# Patient Record
Sex: Female | Born: 1990 | Race: White | Hispanic: No | Marital: Single | Smoking: Current every day smoker
Health system: Southern US, Community
[De-identification: ages and names within clinical notes are randomized; demographics above are authoritative.]

## PROBLEM LIST (undated history)

## (undated) ENCOUNTER — Emergency Department (HOSPITAL_COMMUNITY): Admission: EM | Payer: Medicaid Other | Source: Home / Self Care

## (undated) DIAGNOSIS — F410 Panic disorder [episodic paroxysmal anxiety] without agoraphobia: Secondary | ICD-10-CM

## (undated) DIAGNOSIS — F32A Depression, unspecified: Secondary | ICD-10-CM

## (undated) DIAGNOSIS — D649 Anemia, unspecified: Secondary | ICD-10-CM

## (undated) DIAGNOSIS — Z3490 Encounter for supervision of normal pregnancy, unspecified, unspecified trimester: Secondary | ICD-10-CM

## (undated) DIAGNOSIS — N390 Urinary tract infection, site not specified: Secondary | ICD-10-CM

## (undated) DIAGNOSIS — N730 Acute parametritis and pelvic cellulitis: Secondary | ICD-10-CM

## (undated) DIAGNOSIS — F329 Major depressive disorder, single episode, unspecified: Secondary | ICD-10-CM

## (undated) DIAGNOSIS — F419 Anxiety disorder, unspecified: Secondary | ICD-10-CM

## (undated) HISTORY — DX: Major depressive disorder, single episode, unspecified: F32.9

## (undated) HISTORY — DX: Anemia, unspecified: D64.9

## (undated) HISTORY — DX: Depression, unspecified: F32.A

## (undated) HISTORY — PX: BLADDER SURGERY: SHX569

---
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
## 2011-05-20 LAB — BASIC METABOLIC PANEL
BUN: 16
CO2: 28
Calcium: 9.2
Chloride: 108
Creatinine, Ser: 0.92
Glucose, Bld: 103 — ABNORMAL HIGH
Potassium: 4
Sodium: 141

## 2011-05-20 LAB — CBC
HCT: 35.8 — ABNORMAL LOW
Hemoglobin: 12.7
MCHC: 35.5
MCV: 92.2
RDW: 13.2

## 2011-05-20 LAB — RAPID URINE DRUG SCREEN, HOSP PERFORMED
Amphetamines: NOT DETECTED
Barbiturates: NOT DETECTED
Benzodiazepines: NOT DETECTED
Cocaine: POSITIVE — AB
Opiates: NOT DETECTED
Tetrahydrocannabinol: POSITIVE — AB

## 2011-05-20 LAB — DIFFERENTIAL
Basophils Absolute: 0
Basophils Relative: 0
Eosinophils Absolute: 0.1
Eosinophils Relative: 2
Lymphocytes Relative: 28
Monocytes Absolute: 0.9

## 2011-05-20 LAB — PREGNANCY, URINE: Preg Test, Ur: NEGATIVE

## 2011-05-20 LAB — ETHANOL: Alcohol, Ethyl (B): <5

## 2011-05-24 LAB — URINALYSIS, ROUTINE W REFLEX MICROSCOPIC
Bilirubin Urine: NEGATIVE
Hgb urine dipstick: NEGATIVE
Specific Gravity, Urine: 1.025
Urobilinogen, UA: 1

## 2011-05-24 LAB — WET PREP, GENITAL: Yeast Wet Prep HPF POC: NONE SEEN

## 2011-05-24 LAB — GC/CHLAMYDIA PROBE AMP, GENITAL: GC Probe Amp, Genital: NEGATIVE

## 2011-05-24 LAB — URINE MICROSCOPIC-ADD ON

## 2011-11-12 ENCOUNTER — Encounter (HOSPITAL_BASED_OUTPATIENT_CLINIC_OR_DEPARTMENT_OTHER): Payer: Self-pay | Admitting: *Deleted

## 2011-11-12 ENCOUNTER — Emergency Department (HOSPITAL_BASED_OUTPATIENT_CLINIC_OR_DEPARTMENT_OTHER)
Admission: EM | Admit: 2011-11-12 | Discharge: 2011-11-12 | Disposition: A | Discharge status: Home Health | Payer: Self-pay | Attending: Emergency Medicine | Admitting: Emergency Medicine

## 2011-11-12 DIAGNOSIS — R300 Dysuria: Secondary | ICD-10-CM | POA: Insufficient documentation

## 2011-11-12 DIAGNOSIS — R350 Frequency of micturition: Secondary | ICD-10-CM | POA: Insufficient documentation

## 2011-11-12 DIAGNOSIS — N898 Other specified noninflammatory disorders of vagina: Secondary | ICD-10-CM | POA: Insufficient documentation

## 2011-11-12 DIAGNOSIS — N390 Urinary tract infection, site not specified: Secondary | ICD-10-CM | POA: Insufficient documentation

## 2011-11-12 DIAGNOSIS — R112 Nausea with vomiting, unspecified: Secondary | ICD-10-CM | POA: Insufficient documentation

## 2011-11-12 DIAGNOSIS — J45909 Unspecified asthma, uncomplicated: Secondary | ICD-10-CM | POA: Insufficient documentation

## 2011-11-12 DIAGNOSIS — M545 Low back pain, unspecified: Secondary | ICD-10-CM | POA: Insufficient documentation

## 2011-11-12 DIAGNOSIS — F17200 Nicotine dependence, unspecified, uncomplicated: Secondary | ICD-10-CM | POA: Insufficient documentation

## 2011-11-12 DIAGNOSIS — N739 Female pelvic inflammatory disease, unspecified: Secondary | ICD-10-CM | POA: Insufficient documentation

## 2011-11-12 DIAGNOSIS — R10819 Abdominal tenderness, unspecified site: Secondary | ICD-10-CM | POA: Insufficient documentation

## 2011-11-12 HISTORY — DX: Urinary tract infection, site not specified: N39.0

## 2011-11-12 LAB — WET PREP, GENITAL

## 2011-11-12 LAB — URINALYSIS, MICROSCOPIC ONLY
Nitrite: NEGATIVE
Specific Gravity, Urine: 1.015 (ref 1.005–1.030)
pH: 6 (ref 5.0–8.0)

## 2011-11-12 LAB — PREGNANCY, URINE: Preg Test, Ur: NEGATIVE

## 2011-11-12 MED ORDER — OXYCODONE-ACETAMINOPHEN 5-325 MG PO TABS
2.0000 | ORAL_TABLET | Freq: Once | ORAL | Status: AC
  Administered 2011-11-12: 2 via ORAL
  Filled 2011-11-12: qty 2

## 2011-11-12 MED ORDER — AZITHROMYCIN 1 G PO PACK
1.0000 g | PACK | Freq: Once | ORAL | Status: AC
  Administered 2011-11-12: 1 g via ORAL
  Filled 2011-11-12: qty 1

## 2011-11-12 MED ORDER — CEPHALEXIN 500 MG PO CAPS: 500.0000 mg | ORAL_CAPSULE | Freq: Four times a day (QID) | ORAL | Status: AC

## 2011-11-12 MED ORDER — CEFTRIAXONE SODIUM 1 G IJ SOLR
1.0000 g | Freq: Once | INTRAMUSCULAR | Status: AC
  Administered 2011-11-12: 1 g via INTRAMUSCULAR
  Filled 2011-11-12: qty 10

## 2011-11-12 MED ORDER — LIDOCAINE HCL (PF) 1 % IJ SOLN
INTRAMUSCULAR | Status: AC
  Administered 2011-11-12: 5 mL
  Filled 2011-11-12: qty 5

## 2011-11-12 MED ORDER — DOXYCYCLINE HYCLATE 100 MG PO CAPS: 100.0000 mg | ORAL_CAPSULE | Freq: Two times a day (BID) | ORAL | Status: AC

## 2011-11-12 NOTE — ED Provider Notes (Signed)
History     CSN: [id number]  Arrival date & time 11/12/11  0945   First MD Initiated Contact with Patient 11/12/11 [phone]      Chief Complaint  Patient presents with  . Dysuria    (Consider location/radiation/quality/duration/timing/severity/associated sxs/prior treatment) The history is provided by the patient.   the patient reports development of vaginal discharge and low back pain approximately one week ago.  She's had associated nausea and vomiting.  Or vaginal discharge has not improved.  She has had unprotected sex.  She does report a history of pelvic inflammatory disease before in the past reports her this reminds her of this.  She also reports dysuria and urinary frequency with associated nausea and vomiting without flank pain.  She denies fevers or chills.  She reports a long-standing history of chronic UTIs and has had bladder surgery when she was younger.  She also reports this is a urinary tract infection.  She reports mild suprapubic tenderness.  Nothing worsens her symptoms.  Nothing improves her symptoms.  Symptoms are constant.  Past Medical History  Diagnosis Date  . UTI (urinary tract infection)   . Asthma     History reviewed. No pertinent past surgical history.  History reviewed. No pertinent family history.  History  Substance Use Topics  . Smoking status: Current Everyday Smoker  . Smokeless tobacco: Not on file  . Alcohol Use:     OB History    Grav Para Term Preterm Abortions TAB SAB Ect Mult Living                  Review of Systems  All other systems reviewed and are negative.    Allergies  Sulfa antibiotics  Home Medications   Current Outpatient Rx  Name Route Sig Dispense Refill  . CEPHALEXIN 500 MG PO CAPS Oral Take 1 capsule (500 mg total) by mouth 4 (four) times daily. 28 capsule 0  . DOXYCYCLINE HYCLATE 100 MG PO CAPS Oral Take 1 capsule (100 mg total) by mouth 2 (two) times daily. 14 capsule 0    BP 119/77  Pulse 88  Temp(Src)  97.3 F (36.3 C) (Oral)  Resp 20  SpO2 100%  LMP 11/01/2011  Physical Exam  Nursing note and vitals reviewed. Constitutional: She is oriented to person, place, and time. She appears well-developed and well-nourished. No distress.  HENT:  Head: Normocephalic and atraumatic.  Eyes: EOM are normal.  Neck: Normal range of motion.  Cardiovascular: Normal rate, regular rhythm and normal heart sounds.   Pulmonary/Chest: Effort normal and breath sounds normal.  Abdominal: Soft. She exhibits no distension. There is no tenderness.  Genitourinary:       Normal external genitalia.  Significant vaginal discharge.  Cervical motion tenderness with cervical erythema.  No adnexal masses or fullness  Musculoskeletal: Normal range of motion.  Neurological: She is alert and oriented to person, place, and time.  Skin: Skin is warm and dry.  Psychiatric: She has a normal mood and affect. Judgment normal.    ED Course  Procedures (including critical care time)  Labs Reviewed  URINALYSIS, WITH MICROSCOPIC - Abnormal; Notable for the following:    APPearance CLOUDY (*)    Hgb urine dipstick SMALL (*)    Leukocytes, UA MODERATE (*)    Bacteria, UA MANY (*)    Squamous Epithelial / LPF FEW (*)    All other components within normal limits  WET PREP, GENITAL - Abnormal; Notable for the following:  Trich, Wet Prep FEW (*)    Clue Cells Wet Prep HPF POC MANY (*)    WBC, Wet Prep HPF POC MANY (*)    All other components within normal limits  PREGNANCY, URINE  GC/CHLAMYDIA PROBE AMP, GENITAL  URINE CULTURE   No results found.   1. Pelvic inflammatory disease (PID)   2. Urinary tract infection       MDM  Patient feels much better at this time.  DC home with treatment for both UTI and PID.  She understands the importance of returning for new or worsening symptoms        Lyanne Co, MD 11/12/11 1229

## 2011-11-12 NOTE — ED Notes (Signed)
Pt amb to room 9 with quick steady gait smiling in nad. Pt reports her usual uti sx, dysuria, lbp, states she has had chronic uti's with bladder surgery since the age 21.

## 2011-11-12 NOTE — ED Notes (Signed)
Spoke to a Teacher, early years/pre at [facility] in [place]. Called regarding pt stating she could not afford the $50 Doxy Rx. Dr Fredderick Phenix made aware and stated the only alternative medication would be Clindamycin PO. Pharmacist states the cost of that Rx would be $75. Pt states she could not afford that either. Pt aware that no cheaper antitbiotic was available.

## 2011-11-12 NOTE — Discharge Instructions (Signed)
Pelvic Inflammatory Disease  Pelvic Inflammatory Disease (PID) is an infection in some or all of your female organs. This includes the womb (uterus), ovaries, fallopian tubes and tissues in the pelvis. PID is a common cause of sudden onset (acute) lower abdominal (pelvic) pain. PID can be treated, but it is a serious infection. It may take weeks before you are completely well. In some cases, hospitalization is needed for surgery or to administer medications to kill germs (antibiotics) through your veins (intravenously).  CAUSES    It may be caused by germs that are spread during sexual contact.   PID can also occur following:   The birth of a baby.   A miscarriage.   An abortion.   Major surgery of the pelvis.   Use of an IUD.   Sexual assault.  SYMPTOMS    Abdominal or pelvic pain.   Fever.   Chills.   Abnormal vaginal discharge.  DIAGNOSIS   Your caregiver will choose some of these methods to make a diagnosis:   A physical exam and history.   Blood tests.   Cultures of the vagina and cervix.   X-rays or ultrasound.   A procedure to look inside the pelvis (laparoscopy).  TREATMENT    Use of antibiotics by mouth or intravenously.   Treatment of sexual partners when the infection is an sexually transmitted disease (STD).   Hospitalization and surgery may be needed.  RISKS AND COMPLICATIONS    PID can cause women to become unable to have children (sterile) if left untreated or if partially treated. That is why it is important to finish all medications given to you.   Sterility or future tubal (ectopic) pregnancies can occur in fully treated individuals. This is why it is so important to follow your prescribed treatment.   It can cause longstanding (chronic) pelvic pain after frequent infections.   Painful intercourse.   Pelvic abscesses.   In rare cases, surgery or a hysterectomy may be needed.   If this is a sexually transmitted infection (STI), you are also at risk for any other STD  including AIDSor human papillomavirus (HPV).  HOME CARE INSTRUCTIONS    Finish all medication as prescribed. Incomplete treatment will put you at risk for sterility and tubal pregnancy.   Only take over-the-counter or prescription medicines for pain, discomfort, or fever as directed by your caregiver.   Do not have sex until treatment is completed or as directed by your caregiver. If PID is confirmed, your recent sexual contacts will need treatment.   Keep your follow-up appointments.  SEEK MEDICAL CARE IF:    You have increased or abnormal vaginal discharge.   You need prescription medication for your pain.   Your partner has an STD.   You are vomiting.   You cannot take your medications.  SEEK IMMEDIATE MEDICAL CARE IF:    You have a fever.   You develop increased abdominal or pelvic pain.   You develop chills.   You have pain when you urinate.   You are not better after 72 hours following treatment.  Document Released: 08/09/2005 Document Revised: 07/29/2011 Document Reviewed: 04/22/2007  [employer] Patient Information 2012 [employer].

## 2011-11-15 LAB — URINE CULTURE: Culture  Setup Time: 201303230221

## 2011-11-16 NOTE — ED Notes (Signed)
Pt called back today to check on her cultures. GC negative but pt does have uti with >100,000 ecoli. Pt reports that she did not get her prescriptions filled because of their cost. Talked with pt about getting the doxy changed to generic but it will still be $50 and she can't afford it. Pt also did not get her Keflex presciption filled, and pt is still having symptoms of uti. Encouraged pt to try and get prescriptions filled or return to ed for further treatment.

## 2011-11-16 NOTE — ED Notes (Signed)
+   Urine  Treated per protocol MD; Sensitive to same 

## 2012-01-22 ENCOUNTER — Emergency Department (HOSPITAL_COMMUNITY)
Admission: EM | Admit: 2012-01-22 | Discharge: 2012-01-22 | Disposition: A | Discharge status: Home / Self Care | Payer: Self-pay | Attending: Emergency Medicine | Admitting: Emergency Medicine

## 2012-01-22 ENCOUNTER — Encounter (HOSPITAL_COMMUNITY): Payer: Self-pay | Admitting: Emergency Medicine

## 2012-01-22 DIAGNOSIS — IMO0002 Reserved for concepts with insufficient information to code with codable children: Secondary | ICD-10-CM

## 2012-01-22 DIAGNOSIS — F17200 Nicotine dependence, unspecified, uncomplicated: Secondary | ICD-10-CM | POA: Insufficient documentation

## 2012-01-22 DIAGNOSIS — S91309A Unspecified open wound, unspecified foot, initial encounter: Secondary | ICD-10-CM | POA: Insufficient documentation

## 2012-01-22 DIAGNOSIS — W268XXA Contact with other sharp object(s), not elsewhere classified, initial encounter: Secondary | ICD-10-CM | POA: Insufficient documentation

## 2012-01-22 MED ORDER — LIDOCAINE-EPINEPHRINE (PF) 1 %-1:200000 IJ SOLN
INTRAMUSCULAR | Status: AC
  Administered 2012-01-22: 10 mL
  Filled 2012-01-22: qty 10

## 2012-01-22 MED ORDER — LIDOCAINE-EPINEPHRINE-TETRACAINE (LET) SOLUTION
3.0000 mL | Freq: Once | NASAL | Status: AC
  Administered 2012-01-22: 3 mL via TOPICAL
  Filled 2012-01-22: qty 3

## 2012-01-22 MED ORDER — IBUPROFEN 800 MG PO TABS
800.0000 mg | ORAL_TABLET | Freq: Once | ORAL | Status: AC
  Administered 2012-01-22: 800 mg via ORAL
  Filled 2012-01-22: qty 1

## 2012-01-22 MED ORDER — IBUPROFEN 600 MG PO TABS: 600.0000 mg | ORAL_TABLET | Freq: Four times a day (QID) | ORAL | Status: AC | PRN

## 2012-01-22 NOTE — ED Notes (Signed)
MD at bedside. PAC at bedside. Suturing lac on foot,

## 2012-01-22 NOTE — ED Notes (Signed)
Pt has laceration to the top of her left foot. Pt does not know how she did it. Bleeding controlled at this time with gauze and coban.
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
## 2012-01-22 NOTE — ED Notes (Signed)
Pt ambulated to restroom, causing laceration to bleed. Wound wrapped with cling, let remains secured to wound.
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
## 2012-01-22 NOTE — Discharge Instructions (Signed)

## 2012-01-23 NOTE — ED Provider Notes (Signed)
Medical screening examination/treatment/procedure(s) were performed by non-physician practitioner and as supervising physician I was immediately available for consultation/collaboration.   Dayton Bailiff, MD 01/23/12 [phone]

## 2012-05-08 ENCOUNTER — Emergency Department (HOSPITAL_COMMUNITY)
Admission: EM | Admit: 2012-05-08 | Discharge: 2012-05-08 | Disposition: A | Discharge status: Home / Self Care | Payer: Self-pay | Attending: Emergency Medicine | Admitting: Emergency Medicine

## 2012-05-08 ENCOUNTER — Encounter (HOSPITAL_COMMUNITY): Payer: Self-pay

## 2012-05-08 DIAGNOSIS — Z202 Contact with and (suspected) exposure to infections with a predominantly sexual mode of transmission: Secondary | ICD-10-CM

## 2012-05-08 DIAGNOSIS — F17200 Nicotine dependence, unspecified, uncomplicated: Secondary | ICD-10-CM | POA: Insufficient documentation

## 2012-05-08 DIAGNOSIS — N390 Urinary tract infection, site not specified: Secondary | ICD-10-CM | POA: Insufficient documentation

## 2012-05-08 HISTORY — DX: Acute parametritis and pelvic cellulitis: N73.0

## 2012-05-08 LAB — URINALYSIS, ROUTINE W REFLEX MICROSCOPIC
Bilirubin Urine: NEGATIVE
Nitrite: POSITIVE — AB
Specific Gravity, Urine: 1.025 (ref 1.005–1.030)
pH: 6 (ref 5.0–8.0)

## 2012-05-08 LAB — BASIC METABOLIC PANEL
CO2: 28 mEq/L (ref 19–32)
GFR calc non Af Amer: >90 mL/min (ref >90)
Glucose, Bld: 91 mg/dL (ref 70–99)
Potassium: 4 mEq/L (ref 3.5–5.1)
Sodium: 138 mEq/L (ref 135–145)

## 2012-05-08 LAB — POCT PREGNANCY, URINE: Preg Test, Ur: NEGATIVE

## 2012-05-08 LAB — CBC WITH DIFFERENTIAL/PLATELET
Lymphocytes Relative: 42 % (ref 12–46)
Lymphs Abs: 2.2 10*3/uL (ref 0.7–4.0)
Neutrophils Relative %: 46 % (ref 43–77)
Platelets: 145 10*3/uL — ABNORMAL LOW (ref 150–400)
RBC: 4.05 MIL/uL (ref 3.87–5.11)
WBC: 5.2 10*3/uL (ref 4.0–10.5)

## 2012-05-08 LAB — URINE MICROSCOPIC-ADD ON

## 2012-05-08 LAB — WET PREP, GENITAL: Trich, Wet Prep: NONE SEEN

## 2012-05-08 MED ORDER — SODIUM CHLORIDE 0.9 % IV BOLUS (SEPSIS)
250.0000 mL | Freq: Once | INTRAVENOUS | Status: AC
  Administered 2012-05-08: 1000 mL via INTRAVENOUS

## 2012-05-08 MED ORDER — DEXTROSE 5 % IV SOLN
1.0000 g | Freq: Once | INTRAVENOUS | Status: AC
  Administered 2012-05-08: 1 g via INTRAVENOUS
  Filled 2012-05-08: qty 10

## 2012-05-08 MED ORDER — AZITHROMYCIN 250 MG PO TABS
1000.0000 mg | ORAL_TABLET | Freq: Once | ORAL | Status: AC
  Administered 2012-05-08: 1000 mg via ORAL
  Filled 2012-05-08: qty 4

## 2012-05-08 MED ORDER — CEPHALEXIN 500 MG PO CAPS: 500.0000 mg | ORAL_CAPSULE | Freq: Four times a day (QID) | ORAL | Status: DC

## 2012-05-08 MED ORDER — NAPROXEN 500 MG PO TABS: 500.0000 mg | ORAL_TABLET | Freq: Two times a day (BID) | ORAL | Status: DC

## 2012-05-08 MED ORDER — SODIUM CHLORIDE 0.9 % IV SOLN: INTRAVENOUS | Status: DC

## 2012-05-08 NOTE — ED Provider Notes (Signed)
History   This chart was scribed for Adrienne Jakes, MD by Gerlean Ren. This patient was seen in room [facility]08/[facility]08 and the patient's care was started at 7:58AM.   CSN: [id number]  Arrival date & time 05/08/12  7829   First MD Initiated Contact with Patient 05/08/12 [phone]      Chief Complaint  Patient presents with  . Back Pain    (Consider location/radiation/quality/duration/timing/severity/associated sxs/prior treatment) Patient is a 21 y.o. female presenting with back pain. The history is provided by the patient. No language interpreter was used.  Back Pain  Associated symptoms include a fever, headaches and abdominal pain (lower quadrants). Pertinent negatives include no chest pain.  NATALEAH SCIONEAUX is a 21 y.o. female who presents to the Emergency Department complaining of two weeks of lower quadrant abdominal pain that is usually dull (7 out of 10) but sometimes sharp radiating to lower back with associated clear, non-bloody vaginal discharge.  Pt reprots fever, visual disturbances, near-fainting, sore throat, and HA as associated symptoms.  LNMP 04/24/2012.  Pt denies chest pain and dyspnea as associated.  Pt has h/o UTI and PID.  Pt has had bladder surgery.  Pt is a current everyday smoker but denies current alcohol use.   Past Medical History  Diagnosis Date  . UTI (urinary tract infection)   . Asthma   . PID (acute pelvic inflammatory disease)     Past Surgical History  Procedure Date  . Bladder surgery     No family history on file.  History  Substance Use Topics  . Smoking status: Current Every Day Smoker    Types: Cigarettes  . Smokeless tobacco: Not on file  . Alcohol Use: Yes     former    OB History    Grav Para Term Preterm Abortions TAB SAB Ect Mult Living                  Review of Systems  Constitutional: Positive for fever.  HENT: Positive for sore throat. Negative for neck pain.   Eyes: Positive for visual disturbance.  Respiratory:  Negative for shortness of breath.   Cardiovascular: Negative for chest pain.  Gastrointestinal: Positive for abdominal pain (lower quadrants).  Genitourinary: Positive for vaginal discharge. Negative for vaginal bleeding.  Musculoskeletal: Positive for back pain.  Skin: Negative for rash.  Neurological: Positive for light-headedness and headaches.  Hematological: Negative for adenopathy.  Psychiatric/Behavioral: Negative for confusion.  All other systems reviewed and are negative.    Allergies  Sulfa antibiotics  Home Medications   Current Outpatient Rx  Name Route Sig Dispense Refill  . IBUPROFEN 200 MG PO TABS Oral Take 800 mg by mouth every 6 (six) hours as needed. Pain    . CEPHALEXIN 500 MG PO CAPS Oral Take 1 capsule (500 mg total) by mouth 4 (four) times daily. 28 capsule 0  . NAPROXEN 500 MG PO TABS Oral Take 1 tablet (500 mg total) by mouth 2 (two) times daily. 14 tablet 0    BP 113/67  Pulse 64  Temp 97.7 F (36.5 C) (Oral)  Resp 18  Ht 5\' 6"  (1.676 m)  Wt 147 lb (66.679 kg)  BMI 23.73 kg/m2  SpO2 100%  LMP 04/24/2012  Physical Exam  Nursing note and vitals reviewed. Constitutional: She is oriented to person, place, and time. She appears well-developed and well-nourished.  HENT:  Head: Normocephalic and atraumatic.  Mouth/Throat: Oropharynx is clear and moist.  Eyes: Conjunctivae normal and EOM are  normal. Pupils are equal, round, and reactive to light.  Neck: Normal range of motion. Neck supple.  Cardiovascular: Normal rate, regular rhythm and normal heart sounds.   No murmur heard. Pulmonary/Chest: Effort normal and breath sounds normal. She has no wheezes.  Abdominal: Soft. Bowel sounds are normal. There is no tenderness.       Mild tenderness over LLQ  Genitourinary:       Milky discharge.  No bleeding.   No cervical motion tenderness. No uterine tenderness. Mild left adnexa tenderness. Right adnexa non-tender.  Musculoskeletal: Normal range of  motion. She exhibits no edema.       No CVA tenderness on back.  Lymphadenopathy:    She has no cervical adenopathy.  Neurological: She is alert and oriented to person, place, and time. No cranial nerve deficit.  Skin: Skin is warm and dry. No rash noted.  Psychiatric: She has a normal mood and affect.    ED Course  Procedures (including critical care time) DIAGNOSTIC STUDIES: Oxygen Saturation is 100% on room air, normal by my interpretation.    COORDINATION OF CARE: 8:04AM- Discussed treatment plan with pt at bedside and pt agreed to plan.    Labs Reviewed  URINALYSIS, ROUTINE W REFLEX MICROSCOPIC - Abnormal; Notable for the following:    APPearance HAZY (*)     Hgb urine dipstick TRACE (*)     Nitrite POSITIVE (*)     Leukocytes, UA SMALL (*)     All other components within normal limits  WET PREP, GENITAL - Abnormal; Notable for the following:    Clue Cells Wet Prep HPF POC FEW (*)     WBC, Wet Prep HPF POC FEW (*)     All other components within normal limits  CBC WITH DIFFERENTIAL - Abnormal; Notable for the following:    Platelets 145 (*)     All other components within normal limits  URINE MICROSCOPIC-ADD ON - Abnormal; Notable for the following:    Bacteria, UA MANY (*)     All other components within normal limits  POCT PREGNANCY, URINE  BASIC METABOLIC PANEL  GC/CHLAMYDIA PROBE AMP, GENITAL   No results found.  Results for orders placed during the hospital encounter of 05/08/12  URINALYSIS, ROUTINE W REFLEX MICROSCOPIC      Component Value Range   Color, Urine YELLOW  YELLOW   APPearance HAZY (*) CLEAR   Specific Gravity, Urine 1.025  1.005 - 1.030   pH 6.0  5.0 - 8.0   Glucose, UA NEGATIVE  NEGATIVE mg/dL   Hgb urine dipstick TRACE (*) NEGATIVE   Bilirubin Urine NEGATIVE  NEGATIVE   Ketones, ur NEGATIVE  NEGATIVE mg/dL   Protein, ur NEGATIVE  NEGATIVE mg/dL   Urobilinogen, UA 0.2  0.0 - 1.0 mg/dL   Nitrite POSITIVE (*) NEGATIVE   Leukocytes, UA  SMALL (*) NEGATIVE  POCT PREGNANCY, URINE      Component Value Range   Preg Test, Ur NEGATIVE  NEGATIVE  WET PREP, GENITAL      Component Value Range   Yeast Wet Prep HPF POC NONE SEEN  NONE SEEN   Trich, Wet Prep NONE SEEN  NONE SEEN   Clue Cells Wet Prep HPF POC FEW (*) NONE SEEN   WBC, Wet Prep HPF POC FEW (*) NONE SEEN  CBC WITH DIFFERENTIAL      Component Value Range   WBC 5.2  4.0 - 10.5 K/uL   RBC 4.05  3.87 - 5.11 MIL/uL  Hemoglobin 13.2  12.0 - 15.0 g/dL   HCT 16.1  09.6 - 04.5 %   MCV 96.5  78.0 - 100.0 fL   MCH 32.6  26.0 - 34.0 pg   MCHC 33.8  30.0 - 36.0 g/dL   RDW 40.9  81.1 - 91.4 %   Platelets 145 (*) 150 - 400 K/uL   Neutrophils Relative 46  43 - 77 %   Neutro Abs 2.4  1.7 - 7.7 K/uL   Lymphocytes Relative 42  12 - 46 %   Lymphs Abs 2.2  0.7 - 4.0 K/uL   Monocytes Relative 10  3 - 12 %   Monocytes Absolute 0.5  0.1 - 1.0 K/uL   Eosinophils Relative 1  0 - 5 %   Eosinophils Absolute 0.1  0.0 - 0.7 K/uL   Basophils Relative 1  0 - 1 %   Basophils Absolute 0.0  0.0 - 0.1 K/uL  BASIC METABOLIC PANEL      Component Value Range   Sodium 138  135 - 145 mEq/L   Potassium 4.0  3.5 - 5.1 mEq/L   Chloride 103  96 - 112 mEq/L   CO2 28  19 - 32 mEq/L   Glucose, Bld 91  70 - 99 mg/dL   BUN 14  6 - 23 mg/dL   Creatinine, Ser 7.82  0.50 - 1.10 mg/dL   Calcium 9.3  8.4 - 95.6 mg/dL   GFR calc non Af Amer >90  >90 mL/min   GFR calc Af Amer >90  >90 mL/min  URINE MICROSCOPIC-ADD ON      Component Value Range   WBC, UA TOO NUMEROUS TO COUNT  <3 WBC/hpf   RBC / HPF TOO NUMEROUS TO COUNT  <3 RBC/hpf   Bacteria, UA MANY (*) RARE    1. Urinary tract infection   2. Possible exposure to STD       MDM  And patient's urinalysis consistent with urinary tract infection and probably some early pyelonephritis. Treated in emergency part 1 g IV Rocephin. Patient also most likely had a potential STD exposure treated with 1 g of Zithromax to cover that as well. Your privacy  test was negative. Pelvic examination without significant findings.   I personally performed the services described in this documentation, which was scribed in my presence. The recorded information has been reviewed and considered.          Adrienne Jakes, MD 05/08/12 [phone]

## 2012-05-08 NOTE — ED Notes (Signed)
Pt able to state the importance of taking the antibiotics.

## 2012-05-08 NOTE — ED Notes (Signed)
Pelvic cart placed in room  

## 2012-05-08 NOTE — ED Notes (Signed)
Dr. Deretha Emory at bedside talking with pt and family

## 2012-05-08 NOTE — ED Notes (Signed)
Pt c/o pain in lower back for past few days.  Reports has had vaginal discharge also.  Denies burning with urination.

## 2012-05-08 NOTE — ED Notes (Signed)
Pelvic exam performed by Dr. Deretha Emory, pt tolerated well. Update given on plan of care

## 2012-05-08 NOTE — ED Notes (Signed)
Pt c/o lower back pain, vaginal discharge, pt states that she started having lower back pain few days ago, vaginal discharge unsure of any color, does have odor that started a month ago.

## 2012-06-14 ENCOUNTER — Emergency Department (HOSPITAL_COMMUNITY)
Admission: EM | Admit: 2012-06-14 | Discharge: 2012-06-14 | Disposition: A | Discharge status: Home / Self Care | Payer: Self-pay | Attending: Emergency Medicine | Admitting: Emergency Medicine

## 2012-06-14 ENCOUNTER — Encounter (HOSPITAL_COMMUNITY): Payer: Self-pay | Admitting: *Deleted

## 2012-06-14 DIAGNOSIS — F17200 Nicotine dependence, unspecified, uncomplicated: Secondary | ICD-10-CM | POA: Insufficient documentation

## 2012-06-14 DIAGNOSIS — O219 Vomiting of pregnancy, unspecified: Secondary | ICD-10-CM | POA: Insufficient documentation

## 2012-06-14 DIAGNOSIS — R21 Rash and other nonspecific skin eruption: Secondary | ICD-10-CM | POA: Insufficient documentation

## 2012-06-14 DIAGNOSIS — O9989 Other specified diseases and conditions complicating pregnancy, childbirth and the puerperium: Secondary | ICD-10-CM | POA: Insufficient documentation

## 2012-06-14 DIAGNOSIS — Z87440 Personal history of urinary (tract) infections: Secondary | ICD-10-CM | POA: Insufficient documentation

## 2012-06-14 DIAGNOSIS — J45909 Unspecified asthma, uncomplicated: Secondary | ICD-10-CM | POA: Insufficient documentation

## 2012-06-14 DIAGNOSIS — Z8742 Personal history of other diseases of the female genital tract: Secondary | ICD-10-CM | POA: Insufficient documentation

## 2012-06-14 DIAGNOSIS — Z3490 Encounter for supervision of normal pregnancy, unspecified, unspecified trimester: Secondary | ICD-10-CM

## 2012-06-14 MED ORDER — PERMETHRIN 5 % EX CREA: TOPICAL_CREAM | CUTANEOUS | Status: DC

## 2012-06-14 MED ORDER — SODIUM CHLORIDE 0.9 % IV SOLN: 1000.0000 mL | Freq: Once | INTRAVENOUS | Status: DC

## 2012-06-14 MED ORDER — SODIUM CHLORIDE 0.9 % IV SOLN: 1000.0000 mL | INTRAVENOUS | Status: DC

## 2012-06-14 MED ORDER — PRENATAL COMPLETE 14-0.4 MG PO TABS: 1.0000 | ORAL_TABLET | Freq: Every day | ORAL | Status: DC

## 2012-06-14 MED ORDER — ONDANSETRON 8 MG PO TBDP: 8.0000 mg | ORAL_TABLET | Freq: Three times a day (TID) | ORAL | Status: DC | PRN

## 2012-06-14 MED ORDER — ONDANSETRON 8 MG PO TBDP
8.0000 mg | ORAL_TABLET | Freq: Once | ORAL | Status: AC
  Administered 2012-06-14: 8 mg via ORAL
  Filled 2012-06-14: qty 1

## 2012-06-14 MED ORDER — ONDANSETRON HCL 4 MG/2ML IJ SOLN: 4.0000 mg | Freq: Once | INTRAMUSCULAR | Status: DC

## 2012-06-14 NOTE — ED Provider Notes (Signed)
History     CSN: [id number]  Arrival date & time 06/14/12  1335   First MD Initiated Contact with Patient 06/14/12 1346      Chief Complaint  Patient presents with  . Emesis     The history is provided by the patient.   patient reports her last normal menstrual period was in August and she has had no vaginal bleeding since then.  She is concerned that she may be pregnant as she's had a lot of morning sickness.  She's had nausea and vomiting.  Her vomit his been nonbloody nonbilious. It tends to be in the morning.  She denies diarrhea.  She has no abdominal pain.  She denies vaginal bleeding.  She has no dysuria or urinary frequency.  She states she has not checked a home pregnancy test that she does not believe them to be accurate.  She also reports a new rash on her arms and reports a history of scabies before in the past and states this feels normal.  There are other individuals in the house with the same rash.  He tends to itch and is mainly located on her right wrist at this time.  Past Medical History  Diagnosis Date  . UTI (urinary tract infection)   . Asthma   . PID (acute pelvic inflammatory disease)     Past Surgical History  Procedure Date  . Bladder surgery     History reviewed. No pertinent family history.  History  Substance Use Topics  . Smoking status: Current Every Day Smoker    Types: Cigarettes  . Smokeless tobacco: Not on file  . Alcohol Use: Yes     former    OB History    Grav Para Term Preterm Abortions TAB SAB Ect Mult Living                  Review of Systems  All other systems reviewed and are negative.    Allergies  Sulfa antibiotics  Home Medications  No current outpatient prescriptions on file.  BP 101/73  Pulse 79  Temp 97.8 F (36.6 C) (Oral)  Resp 20  Ht 5\' 6"  (1.676 m)  SpO2 100%  LMP 04/16/2012  Physical Exam  Nursing note and vitals reviewed. Constitutional: She is oriented to person, place, and time. She appears  well-developed and well-nourished. No distress.  HENT:  Head: Normocephalic and atraumatic.  Eyes: EOM are normal.  Neck: Normal range of motion.  Cardiovascular: Normal rate, regular rhythm and normal heart sounds.   Pulmonary/Chest: Effort normal and breath sounds normal.  Abdominal: Soft. She exhibits no distension. There is no tenderness.  Musculoskeletal: Normal range of motion.  Neurological: She is alert and oriented to person, place, and time.  Skin: Skin is warm and dry.       Rash of right wrist and arm consistent with scabies.  No secondary signs of infection  Psychiatric: She has a normal mood and affect. Judgment normal.    ED Course  Procedures (including critical care time)  Labs Reviewed  POCT PREGNANCY, URINE - Abnormal; Notable for the following:    Preg Test, Ur POSITIVE (*)     All other components within normal limits   No results found.   1. Pregnant   2. Rash       MDM  Patient is pregnant.  She has no abdominal pain.  She has no vaginal bleeding.  Ectopic precautions given.  Followup with OB/GYN.  She'll be  treated for possible scabies.       Lyanne Co, MD 06/14/12 1444

## 2012-06-14 NOTE — ED Notes (Signed)
N/v for 1 week, thinks she may be pregnant.  ? Scabies, x 1 month

## 2012-07-11 LAB — OB RESULTS CONSOLE ABO/RH: RH Type: POSITIVE

## 2012-07-11 LAB — OB RESULTS CONSOLE ANTIBODY SCREEN: Antibody Screen: NEGATIVE

## 2012-08-04 ENCOUNTER — Encounter (HOSPITAL_COMMUNITY): Payer: Self-pay | Admitting: Emergency Medicine

## 2012-08-04 ENCOUNTER — Emergency Department (HOSPITAL_COMMUNITY): Payer: Medicaid Other

## 2012-08-04 ENCOUNTER — Emergency Department (HOSPITAL_COMMUNITY)
Admission: EM | Admit: 2012-08-04 | Discharge: 2012-08-04 | Disposition: A | Discharge status: Home / Self Care | Payer: Medicaid Other | Attending: Emergency Medicine | Admitting: Emergency Medicine

## 2012-08-04 DIAGNOSIS — K529 Noninfective gastroenteritis and colitis, unspecified: Secondary | ICD-10-CM

## 2012-08-04 DIAGNOSIS — R109 Unspecified abdominal pain: Secondary | ICD-10-CM | POA: Insufficient documentation

## 2012-08-04 DIAGNOSIS — Z79899 Other long term (current) drug therapy: Secondary | ICD-10-CM | POA: Insufficient documentation

## 2012-08-04 DIAGNOSIS — J45909 Unspecified asthma, uncomplicated: Secondary | ICD-10-CM | POA: Insufficient documentation

## 2012-08-04 DIAGNOSIS — K5289 Other specified noninfective gastroenteritis and colitis: Secondary | ICD-10-CM | POA: Insufficient documentation

## 2012-08-04 DIAGNOSIS — R6883 Chills (without fever): Secondary | ICD-10-CM | POA: Insufficient documentation

## 2012-08-04 DIAGNOSIS — Z87440 Personal history of urinary (tract) infections: Secondary | ICD-10-CM | POA: Insufficient documentation

## 2012-08-04 DIAGNOSIS — Z8742 Personal history of other diseases of the female genital tract: Secondary | ICD-10-CM | POA: Insufficient documentation

## 2012-08-04 DIAGNOSIS — R197 Diarrhea, unspecified: Secondary | ICD-10-CM | POA: Insufficient documentation

## 2012-08-04 DIAGNOSIS — F17200 Nicotine dependence, unspecified, uncomplicated: Secondary | ICD-10-CM | POA: Insufficient documentation

## 2012-08-04 HISTORY — DX: Encounter for supervision of normal pregnancy, unspecified, unspecified trimester: Z34.90

## 2012-08-04 LAB — BASIC METABOLIC PANEL
BUN: 13 mg/dL (ref 6–23)
CO2: 25 mEq/L (ref 19–32)
Chloride: 102 mEq/L (ref 96–112)
Creatinine, Ser: 0.53 mg/dL (ref 0.50–1.10)
Potassium: 3.6 mEq/L (ref 3.5–5.1)
Sodium: 134 mEq/L — ABNORMAL LOW (ref 135–145)

## 2012-08-04 LAB — CBC WITH DIFFERENTIAL/PLATELET
Basophils Absolute: 0 10*3/uL (ref 0.0–0.1)
Eosinophils Absolute: 0 10*3/uL (ref 0.0–0.7)
Lymphs Abs: 0.7 10*3/uL (ref 0.7–4.0)
MCH: 33.3 pg (ref 26.0–34.0)
Neutrophils Relative %: 88 % — ABNORMAL HIGH (ref 43–77)
Platelets: 149 10*3/uL — ABNORMAL LOW (ref 150–400)
RBC: 4.03 MIL/uL (ref 3.87–5.11)
RDW: 12.9 % (ref 11.5–15.5)
WBC: 9.5 10*3/uL (ref 4.0–10.5)

## 2012-08-04 MED ORDER — ONDANSETRON HCL 4 MG/2ML IJ SOLN
4.0000 mg | Freq: Once | INTRAMUSCULAR | Status: AC
Start: 2012-08-04 — End: 2012-08-04
  Administered 2012-08-04: 4 mg via INTRAVENOUS
  Filled 2012-08-04: qty 2

## 2012-08-04 MED ORDER — MORPHINE SULFATE 4 MG/ML IJ SOLN
4.0000 mg | Freq: Once | INTRAMUSCULAR | Status: AC
  Administered 2012-08-04: 4 mg via INTRAVENOUS
  Filled 2012-08-04: qty 1

## 2012-08-04 MED ORDER — PROMETHAZINE HCL 25 MG PO TABS: 25.0000 mg | ORAL_TABLET | Freq: Four times a day (QID) | ORAL | Status: DC | PRN

## 2012-08-04 MED ORDER — SODIUM CHLORIDE 0.9 % IV BOLUS (SEPSIS)
1000.0000 mL | Freq: Once | INTRAVENOUS | Status: AC
  Administered 2012-08-04: 1000 mL via INTRAVENOUS

## 2012-08-04 MED ORDER — KETOROLAC TROMETHAMINE 30 MG/ML IJ SOLN
30.0000 mg | Freq: Once | INTRAMUSCULAR | Status: AC
  Administered 2012-08-04: 30 mg via INTRAVENOUS
  Filled 2012-08-04: qty 1

## 2012-08-04 NOTE — ED Notes (Signed)
Discharge instructions reviewed with pt, questions answered. Pt verbalized understanding.  

## 2012-08-04 NOTE — ED Provider Notes (Signed)
History   This chart was scribed for Benny Lennert, MD, by Frederik Pear, ER scribe. The patient was seen in room APA15/APA15 and the patient's care was started at [phone].    CSN: [id number]  Arrival date & time 08/04/12  0920   First MD Initiated Contact with Patient 08/04/12 [phone]      Chief Complaint  Patient presents with  . Nausea  . Emesis  . Diarrhea  . Emesis During Pregnancy    (Consider location/radiation/quality/duration/timing/severity/associated sxs/prior treatment) HPI Comments: Adrienne Crawford is a 21 y.o. female who presents to the Emergency Department complaining of constant, moderate emesis 10x with associated watery diarrhea 4x, chills, crampy abdominal pain, and nausea that began last night. She also states that she had synscope 1x earlier in the week, but the issue has since resolved. She reports that she is 4 months pregnant.  Patient is a 21 y.o. female presenting with vomiting. The history is provided by the patient.  Emesis  This is a new problem. The current episode started 12 to 24 hours ago. The problem occurs more than 10 times per day. The problem has not changed since onset.The emesis has an appearance of bilious material. There has been no fever. Associated symptoms include abdominal pain, chills and diarrhea. Pertinent negatives include no cough and no headaches.    Past Medical History  Diagnosis Date  . UTI (urinary tract infection)   . Asthma   . PID (acute pelvic inflammatory disease)   . Pregnant     Past Surgical History  Procedure Date  . Bladder surgery     History reviewed. No pertinent family history.  History  Substance Use Topics  . Smoking status: Current Every Day Smoker    Types: Cigarettes  . Smokeless tobacco: Not on file  . Alcohol Use: Yes     Comment: former    OB History    Grav Para Term Preterm Abortions TAB SAB Ect Mult Living                  Review of Systems  Constitutional: Positive for chills.   HENT: Negative for congestion, sinus pressure and ear discharge.   Eyes: Negative for discharge.  Respiratory: Negative for cough.   Cardiovascular: Negative for chest pain.  Gastrointestinal: Positive for nausea, vomiting, abdominal pain and diarrhea.  Neurological: Negative for seizures and headaches.  Hematological: Negative.   Psychiatric/Behavioral: Negative for hallucinations.  All other systems reviewed and are negative.    Allergies  Sulfa antibiotics  Home Medications   Current Outpatient Rx  Name  Route  Sig  Dispense  Refill  . ONDANSETRON 8 MG PO TBDP   Oral   Take 1 tablet (8 mg total) by mouth every 8 (eight) hours as needed for nausea.   10 tablet   0   . PERMETHRIN 5 % EX CREA      Apply to entire body and leave on for 8 hours.  Wash off.  In 1 week repeat.  Please wash all close and sheets in warm water   60 g   0   . PRENATAL COMPLETE 14-0.4 MG PO TABS   Oral   Take 1 capsule by mouth daily.   60 each   1     BP 122/75  Pulse 75  Temp 98.5 F (36.9 C) (Oral)  Resp 18  SpO2 97%  LMP 04/19/2012  Physical Exam  Constitutional: She is oriented to person, place, and time. She  appears well-developed.  HENT:  Head: Normocephalic and atraumatic.       Her mucus membranes are dry.  Eyes: Conjunctivae normal and EOM are normal. No scleral icterus.  Neck: Neck supple. No thyromegaly present.  Cardiovascular: Normal rate and regular rhythm.  Exam reveals no gallop and no friction rub.   No murmur heard. Pulmonary/Chest: No stridor. She has no wheezes. She has no rales. She exhibits no tenderness.  Abdominal: She exhibits distension. There is tenderness. There is no rebound.       She is mildly distended.  Musculoskeletal: Normal range of motion. She exhibits no edema.  Lymphadenopathy:    She has no cervical adenopathy.  Neurological: She is oriented to person, place, and time. Coordination normal.  Skin: No rash noted. No erythema.   Psychiatric: She has a normal mood and affect. Her behavior is normal.    ED Course  Procedures (including critical care time)  DIAGNOSTIC STUDIES: Oxygen Saturation is 97% on room air, normal by my interpretation.    COORDINATION OF CARE:  09:45- Discussed planned course of treatment with the patient, including blood work, who is agreeable at this time.  10:00- Medication Orders- sodium chloride 0.9% bolus 1,000 mL- Once, ketorolac (TORADOL) 30 MG/ML injection 30 mg- Once.  11:47- Recheck- Her nausea is somewhat better. She was dehydrated, but has not voided since receiving fluids. Discharging with a prescription for nausea medication and ibuprofen for the pain.  Results for orders placed during the hospital encounter of 08/04/12  CBC WITH DIFFERENTIAL      Component Value Range   WBC 9.5  4.0 - 10.5 K/uL   RBC 4.03  3.87 - 5.11 MIL/uL   Hemoglobin 13.4  12.0 - 15.0 g/dL   HCT 40.9  81.1 - 91.4 %   MCV 90.8  78.0 - 100.0 fL   MCH 33.3  26.0 - 34.0 pg   MCHC 36.6 (*) 30.0 - 36.0 g/dL   RDW 78.2  95.6 - 21.3 %   Platelets 149 (*) 150 - 400 K/uL   Neutrophils Relative 88 (*) 43 - 77 %   Neutro Abs 8.3 (*) 1.7 - 7.7 K/uL   Lymphocytes Relative 8 (*) 12 - 46 %   Lymphs Abs 0.7  0.7 - 4.0 K/uL   Monocytes Relative 4  3 - 12 %   Monocytes Absolute 0.4  0.1 - 1.0 K/uL   Eosinophils Relative 0  0 - 5 %   Eosinophils Absolute 0.0  0.0 - 0.7 K/uL   Basophils Relative 0  0 - 1 %   Basophils Absolute 0.0  0.0 - 0.1 K/uL  BASIC METABOLIC PANEL      Component Value Range   Sodium 134 (*) 135 - 145 mEq/L   Potassium 3.6  3.5 - 5.1 mEq/L   Chloride 102  96 - 112 mEq/L   CO2 25  19 - 32 mEq/L   Glucose, Bld 104 (*) 70 - 99 mg/dL   BUN 13  6 - 23 mg/dL   Creatinine, Ser 0.86  0.50 - 1.10 mg/dL   Calcium 8.5  8.4 - 57.8 mg/dL   GFR calc non Af Amer >90  >90 mL/min   GFR calc Af Amer >90  >90 mL/min    Labs Reviewed - No data to display No results found.   No diagnosis  found.  Pt improved some with tx  MDM   The chart was scribed for me under my direct supervision.  I  personally performed the history, physical, and medical decision making and all procedures in the evaluation of this patient.Benny Lennert, MD 08/04/12 [phone]

## 2012-08-04 NOTE — ED Notes (Signed)
Pt refusing to be d/c states nausea is coming back as well as pain.  Upset because abd has not been addressed. edp notified.

## 2012-08-04 NOTE — ED Notes (Signed)
Pt states symptoms began after eating [place] last night. Severe stomach cramping with diarrhea,and vomiting. Pt is about 4 months pregnant

## 2012-08-23 NOTE — L&D Delivery Note (Signed)
Delivery Note At 7:21 PM a viable female was delivered via Vaginal, Spontaneous Delivery (Presentation: Right Occiput Anterior) w/ loose nuchal cord x 1, delivered through. Shoulders were not forthcoming, so pt was placed in mcrobert's position, and then suprapubic pressure performed by RN w/ resulting delivery of anterior (left) shoulder and then remainder of body.  Infant had respirations and cry w/ drying/stimulation. APGAR: not available at time of note; weight: not available at time of note. Placenta status: delivered spontaneously intact, large amount of blood ~400cc in amniotic sac .  Cord: 3vc with the following complications: none.  Cord pH: not done Oozing noted of additional ~200cc- fundus firmed up w/ massage, cytotec 1000mg  pr given w/ resolution of oozing.   Anesthesia: Epidural  Episiotomy: none Lacerations: none Suture Repair: n/a Est. Blood Loss (mL): 600  Mom to AICU.  Baby to nursery-stable. Bottlefeeding, depo for contraception, desires circumcision at [facility] when able to afford.   Marliss, [place] 01/27/2013, 7:52 PM   2000: called back to room by RN d/t hemorrhage. PPH protocol initiated.  Dr. Debroah Loop in w/ me and performed vag exam, manually removed multiple clots- app additional 400cc in clots/blood on pad for total of 1L blood loss.  Pt symptomatic w/ feeling lightheaded, pale, drop in bp.  Fundus firmed up nicely w/ removal of clots. Methergine 0.2mg  IM given.  BP responded nicely, and no continued bleeding. FF U-1.   Will await labs to see if blood transfusion indicated. Marge Duncans 8:24 PM

## 2012-08-23 NOTE — L&D Delivery Note (Signed)
Agree with note  James G Arnold, MD . 

## 2012-08-26 ENCOUNTER — Emergency Department (HOSPITAL_COMMUNITY)
Admission: EM | Admit: 2012-08-26 | Discharge: 2012-08-26 | Disposition: A | Discharge status: Home / Self Care | Payer: Medicaid Other | Attending: Emergency Medicine | Admitting: Emergency Medicine

## 2012-08-26 ENCOUNTER — Encounter (HOSPITAL_COMMUNITY): Payer: Self-pay | Admitting: *Deleted

## 2012-08-26 ENCOUNTER — Emergency Department (HOSPITAL_COMMUNITY): Payer: Medicaid Other

## 2012-08-26 DIAGNOSIS — R55 Syncope and collapse: Secondary | ICD-10-CM | POA: Insufficient documentation

## 2012-08-26 DIAGNOSIS — R5381 Other malaise: Secondary | ICD-10-CM | POA: Insufficient documentation

## 2012-08-26 DIAGNOSIS — Z8742 Personal history of other diseases of the female genital tract: Secondary | ICD-10-CM | POA: Insufficient documentation

## 2012-08-26 DIAGNOSIS — R0602 Shortness of breath: Secondary | ICD-10-CM | POA: Insufficient documentation

## 2012-08-26 DIAGNOSIS — Z87440 Personal history of urinary (tract) infections: Secondary | ICD-10-CM | POA: Insufficient documentation

## 2012-08-26 DIAGNOSIS — O09299 Supervision of pregnancy with other poor reproductive or obstetric history, unspecified trimester: Secondary | ICD-10-CM | POA: Insufficient documentation

## 2012-08-26 DIAGNOSIS — O9989 Other specified diseases and conditions complicating pregnancy, childbirth and the puerperium: Secondary | ICD-10-CM | POA: Insufficient documentation

## 2012-08-26 DIAGNOSIS — M549 Dorsalgia, unspecified: Secondary | ICD-10-CM | POA: Insufficient documentation

## 2012-08-26 DIAGNOSIS — F17200 Nicotine dependence, unspecified, uncomplicated: Secondary | ICD-10-CM | POA: Insufficient documentation

## 2012-08-26 DIAGNOSIS — S060X1A Concussion with loss of consciousness of 30 minutes or less, initial encounter: Secondary | ICD-10-CM | POA: Insufficient documentation

## 2012-08-26 DIAGNOSIS — R079 Chest pain, unspecified: Secondary | ICD-10-CM | POA: Insufficient documentation

## 2012-08-26 DIAGNOSIS — Z8659 Personal history of other mental and behavioral disorders: Secondary | ICD-10-CM | POA: Insufficient documentation

## 2012-08-26 DIAGNOSIS — Z3490 Encounter for supervision of normal pregnancy, unspecified, unspecified trimester: Secondary | ICD-10-CM

## 2012-08-26 DIAGNOSIS — R1031 Right lower quadrant pain: Secondary | ICD-10-CM | POA: Insufficient documentation

## 2012-08-26 DIAGNOSIS — R51 Headache: Secondary | ICD-10-CM | POA: Insufficient documentation

## 2012-08-26 DIAGNOSIS — R5383 Other fatigue: Secondary | ICD-10-CM | POA: Insufficient documentation

## 2012-08-26 DIAGNOSIS — R42 Dizziness and giddiness: Secondary | ICD-10-CM | POA: Insufficient documentation

## 2012-08-26 DIAGNOSIS — J45909 Unspecified asthma, uncomplicated: Secondary | ICD-10-CM | POA: Insufficient documentation

## 2012-08-26 DIAGNOSIS — S060X9A Concussion with loss of consciousness of unspecified duration, initial encounter: Secondary | ICD-10-CM

## 2012-08-26 HISTORY — DX: Anxiety disorder, unspecified: F41.9

## 2012-08-26 HISTORY — DX: Panic disorder (episodic paroxysmal anxiety): F41.0

## 2012-08-26 LAB — URINALYSIS, ROUTINE W REFLEX MICROSCOPIC
Bilirubin Urine: NEGATIVE
Ketones, ur: NEGATIVE mg/dL
Leukocytes, UA: NEGATIVE
Nitrite: NEGATIVE
Protein, ur: NEGATIVE mg/dL
pH: 7 (ref 5.0–8.0)

## 2012-08-26 LAB — CBC WITH DIFFERENTIAL/PLATELET
Basophils Absolute: 0 10*3/uL (ref 0.0–0.1)
Basophils Relative: 0 % (ref 0–1)
Eosinophils Absolute: 0.1 10*3/uL (ref 0.0–0.7)
HCT: 33.3 % — ABNORMAL LOW (ref 36.0–46.0)
Hemoglobin: 12 g/dL (ref 12.0–15.0)
MCH: 33.6 pg (ref 26.0–34.0)
MCHC: 36 g/dL (ref 30.0–36.0)
Monocytes Absolute: 0.4 10*3/uL (ref 0.1–1.0)
Monocytes Relative: 6 % (ref 3–12)
Neutrophils Relative %: 68 % (ref 43–77)
RDW: 13.4 % (ref 11.5–15.5)

## 2012-08-26 LAB — HCG, QUANTITATIVE, PREGNANCY: hCG, Beta Chain, Quant, S: 40309 m[IU]/mL — ABNORMAL HIGH (ref <5)

## 2012-08-26 LAB — BASIC METABOLIC PANEL
BUN: 14 mg/dL (ref 6–23)
Creatinine, Ser: 0.59 mg/dL (ref 0.50–1.10)
GFR calc Af Amer: >90 mL/min (ref >90)
GFR calc non Af Amer: >90 mL/min (ref >90)

## 2012-08-26 MED ORDER — SODIUM CHLORIDE 0.9 % IV BOLUS (SEPSIS)
250.0000 mL | Freq: Once | INTRAVENOUS | Status: AC
  Administered 2012-08-26: 250 mL via INTRAVENOUS

## 2012-08-26 MED ORDER — SODIUM CHLORIDE 0.9 % IV SOLN
INTRAVENOUS | Status: DC
  Administered 2012-08-26: 17:00:00 via INTRAVENOUS
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
## 2012-10-16 ENCOUNTER — Encounter: Payer: Self-pay | Admitting: *Deleted

## 2012-11-16 ENCOUNTER — Encounter: Payer: Self-pay | Admitting: Obstetrics and Gynecology

## 2012-11-16 ENCOUNTER — Other Ambulatory Visit: Payer: Self-pay

## 2012-11-17 ENCOUNTER — Other Ambulatory Visit: Payer: Medicaid Other

## 2012-11-17 DIAGNOSIS — Z3482 Encounter for supervision of other normal pregnancy, second trimester: Secondary | ICD-10-CM

## 2012-11-17 LAB — CBC
Platelets: 185 10*3/uL (ref 150–400)
RBC: 3.31 MIL/uL — ABNORMAL LOW (ref 3.87–5.11)
WBC: 9.7 10*3/uL (ref 4.0–10.5)

## 2012-11-18 LAB — GLUCOSE TOLERANCE, 2 HOURS W/ 1HR
Glucose, 1 hour: 84 mg/dL (ref 70–170)
Glucose, Fasting: 78 mg/dL (ref 70–99)

## 2012-11-18 LAB — ANTIBODY SCREEN: Antibody Screen: NEGATIVE

## 2012-11-18 LAB — HIV ANTIBODY (ROUTINE TESTING W REFLEX): HIV: NONREACTIVE

## 2012-11-20 ENCOUNTER — Encounter: Payer: Self-pay | Admitting: Obstetrics & Gynecology

## 2012-11-22 ENCOUNTER — Encounter: Payer: Self-pay | Admitting: Obstetrics & Gynecology

## 2012-11-22 ENCOUNTER — Ambulatory Visit (INDEPENDENT_AMBULATORY_CARE_PROVIDER_SITE_OTHER): Payer: Medicaid Other | Admitting: Obstetrics & Gynecology

## 2012-11-22 VITALS — BP 110/68 | Wt 173.0 lb

## 2012-11-22 DIAGNOSIS — Z1389 Encounter for screening for other disorder: Secondary | ICD-10-CM

## 2012-11-22 DIAGNOSIS — Z3480 Encounter for supervision of other normal pregnancy, unspecified trimester: Secondary | ICD-10-CM | POA: Insufficient documentation

## 2012-11-22 DIAGNOSIS — O09299 Supervision of pregnancy with other poor reproductive or obstetric history, unspecified trimester: Secondary | ICD-10-CM

## 2012-11-22 DIAGNOSIS — F1920 Other psychoactive substance dependence, uncomplicated: Secondary | ICD-10-CM

## 2012-11-22 DIAGNOSIS — Z3482 Encounter for supervision of other normal pregnancy, second trimester: Secondary | ICD-10-CM

## 2012-11-22 DIAGNOSIS — O98519 Other viral diseases complicating pregnancy, unspecified trimester: Secondary | ICD-10-CM

## 2012-11-22 DIAGNOSIS — Z331 Pregnant state, incidental: Secondary | ICD-10-CM

## 2012-11-22 DIAGNOSIS — O99019 Anemia complicating pregnancy, unspecified trimester: Secondary | ICD-10-CM

## 2012-11-22 DIAGNOSIS — Z3483 Encounter for supervision of other normal pregnancy, third trimester: Secondary | ICD-10-CM

## 2012-11-22 DIAGNOSIS — O99340 Other mental disorders complicating pregnancy, unspecified trimester: Secondary | ICD-10-CM

## 2012-11-22 LAB — POCT URINALYSIS DIPSTICK
Blood, UA: NEGATIVE
Ketones, UA: NEGATIVE

## 2012-11-22 NOTE — Progress Notes (Signed)
Patient reports good fetal movement, denies any bleeding and no rupture of membranes symptoms or contraction +musculoskeletal pain in back, no cvat no triggers   Edema neg

## 2012-11-22 NOTE — Progress Notes (Signed)
C/O having lower back pain x 4 days. Worried about UTI. Had nausea and vomiting a few days ago, couldn't keep anything down. No Nausea now. Having pressure constantly.

## 2012-11-22 NOTE — Patient Instructions (Signed)
Pregnancy - Third Trimester  The third trimester of pregnancy (the last 3 months) is a period of the most rapid growth for you and your baby. The baby approaches a length of 20 inches and a weight of 6 to 10 pounds. The baby is adding on fat and getting ready for life outside your body. While inside, babies have periods of sleeping and waking, suck their thumbs, and hiccups. You can often feel small contractions of the uterus. This is false labor. It is also called Braxton-Hicks contractions. This is like a practice for labor. The usual problems in this stage of pregnancy include more difficulty breathing, swelling of the hands and feet from water retention, and having to urinate more often because of the uterus and baby pressing on your bladder.   PRENATAL EXAMS  · Blood work may continue to be done during prenatal exams. These tests are done to check on your health and the probable health of your baby. Blood work is used to follow your blood levels (hemoglobin). Anemia (low hemoglobin) is common during pregnancy. Iron and vitamins are given to help prevent this. You may also continue to be checked for diabetes. Some of the past blood tests may be done again.  · The size of the uterus is measured during each visit. This makes sure your baby is growing properly according to your pregnancy dates.  · Your blood pressure is checked every prenatal visit. This is to make sure you are not getting toxemia.  · Your urine is checked every prenatal visit for infection, diabetes and protein.  · Your weight is checked at each visit. This is done to make sure gains are happening at the suggested rate and that you and your baby are growing normally.  · Sometimes, an ultrasound is performed to confirm the position and the proper growth and development of the baby. This is a test done that bounces harmless sound waves off the baby so your caregiver can more accurately determine due dates.  · Discuss the type of pain medication and  anesthesia you will have during your labor and delivery.  · Discuss the possibility and anesthesia if a Cesarean Section might be necessary.  · Inform your caregiver if there is any mental or physical violence at home.  Sometimes, a specialized non-stress test, contraction stress test and biophysical profile are done to make sure the baby is not having a problem. Checking the amniotic fluid surrounding the baby is called an amniocentesis. The amniotic fluid is removed by sticking a needle into the belly (abdomen). This is sometimes done near the end of pregnancy if an early delivery is required. In this case, it is done to help make sure the baby's lungs are mature enough for the baby to live outside of the womb. If the lungs are not mature and it is unsafe to deliver the baby, an injection of cortisone medication is given to the mother 1 to 2 days before the delivery. This helps the baby's lungs mature and makes it safer to deliver the baby.  CHANGES OCCURING IN THE THIRD TRIMESTER OF PREGNANCY  Your body goes through many changes during pregnancy. They vary from person to person. Talk to your caregiver about changes you notice and are concerned about.  · During the last trimester, you have probably had an increase in your appetite. It is normal to have cravings for certain foods. This varies from person to person and pregnancy to pregnancy.  · You may begin to   get stretch marks on your hips, abdomen, and breasts. These are normal changes in the body during pregnancy. There are no exercises or medications to take which prevent this change.  · Constipation may be treated with a stool softener or adding bulk to your diet. Drinking lots of fluids, fiber in vegetables, fruits, and whole grains are helpful.  · Exercising is also helpful. If you have been very active up until your pregnancy, most of these activities can be continued during your pregnancy. If you have been less active, it is helpful to start an exercise  program such as walking. Consult your caregiver before starting exercise programs.  · Avoid all smoking, alcohol, un-prescribed drugs, herbs and "street drugs" during your pregnancy. These chemicals affect the formation and growth of the baby. Avoid chemicals throughout the pregnancy to ensure the delivery of a healthy infant.  · Backache, varicose veins and hemorrhoids may develop or get worse.  · You will tire more easily in the third trimester, which is normal.  · The baby's movements may be stronger and more often.  · You may become short of breath easily.  · Your belly button may stick out.  · A yellow discharge may leak from your breasts called colostrum.  · You may have a bloody mucus discharge. This usually occurs a few days to a week before labor begins.  HOME CARE INSTRUCTIONS   · Keep your caregiver's appointments. Follow your caregiver's instructions regarding medication use, exercise, and diet.  · During pregnancy, you are providing food for you and your baby. Continue to eat regular, well-balanced meals. Choose foods such as meat, fish, milk and other low fat dairy products, vegetables, fruits, and whole-grain breads and cereals. Your caregiver will tell you of the ideal weight gain.  · A physical sexual relationship may be continued throughout pregnancy if there are no other problems such as early (premature) leaking of amniotic fluid from the membranes, vaginal bleeding, or belly (abdominal) pain.  · Exercise regularly if there are no restrictions. Check with your caregiver if you are unsure of the safety of your exercises. Greater weight gain will occur in the last 2 trimesters of pregnancy. Exercising helps:  · Control your weight.  · Get you in shape for labor and delivery.  · You lose weight after you deliver.  · Rest a lot with legs elevated, or as needed for leg cramps or low back pain.  · Wear a good support or jogging bra for breast tenderness during pregnancy. This may help if worn during  sleep. Pads or tissues may be used in the bra if you are leaking colostrum.  · Do not use hot tubs, steam rooms, or saunas.  · Wear your seat belt when driving. This protects you and your baby if you are in an accident.  · Avoid raw meat, cat litter boxes and soil used by cats. These carry germs that can cause birth defects in the baby.  · It is easier to loose urine during pregnancy. Tightening up and strengthening the pelvic muscles will help with this problem. You can practice stopping your urination while you are going to the bathroom. These are the same muscles you need to strengthen. It is also the muscles you would use if you were trying to stop from passing gas. You can practice tightening these muscles up 10 times a set and repeating this about 3 times per day. Once you know what muscles to tighten up, do not perform these   exercises during urination. It is more likely to cause an infection by backing up the urine.  · Ask for help if you have financial, counseling or nutritional needs during pregnancy. Your caregiver will be able to offer counseling for these needs as well as refer you for other special needs.  · Make a list of emergency phone numbers and have them available.  · Plan on getting help from family or friends when you go home from the hospital.  · Make a trial run to the hospital.  · Take prenatal classes with the father to understand, practice and ask questions about the labor and delivery.  · Prepare the baby's room/nursery.  · Do not travel out of the city unless it is absolutely necessary and with the advice of your caregiver.  · Wear only low or no heal shoes to have better balance and prevent falling.  MEDICATIONS AND DRUG USE IN PREGNANCY  · Take prenatal vitamins as directed. The vitamin should contain 1 milligram of folic acid. Keep all vitamins out of reach of children. Only a couple vitamins or tablets containing iron may be fatal to a baby or young child when ingested.  · Avoid use  of all medications, including herbs, over-the-counter medications, not prescribed or suggested by your caregiver. Only take over-the-counter or prescription medicines for pain, discomfort, or fever as directed by your caregiver. Do not use aspirin, ibuprofen (Motrin®, Advil®, Nuprin®) or naproxen (Aleve®) unless OK'd by your caregiver.  · Let your caregiver also know about herbs you may be using.  · Alcohol is related to a number of birth defects. This includes fetal alcohol syndrome. All alcohol, in any form, should be avoided completely. Smoking will cause low birth rate and premature babies.  · Street/illegal drugs are very harmful to the baby. They are absolutely forbidden. A baby born to an addicted mother will be addicted at birth. The baby will go through the same withdrawal an adult does.  SEEK MEDICAL CARE IF:  You have any concerns or worries during your pregnancy. It is better to call with your questions if you feel they cannot wait, rather than worry about them.  DECISIONS ABOUT CIRCUMCISION  You may or may not know the sex of your baby. If you know your baby is a boy, it may be time to think about circumcision. Circumcision is the removal of the foreskin of the penis. This is the skin that covers the sensitive end of the penis. There is no proven medical need for this. Often this decision is made on what is popular at the time or based upon religious beliefs and social issues. You can discuss these issues with your caregiver or pediatrician.  SEEK IMMEDIATE MEDICAL CARE IF:   · An unexplained oral temperature above 102° F (38.9° C) develops, or as your caregiver suggests.  · You have leaking of fluid from the vagina (birth canal). If leaking membranes are suspected, take your temperature and tell your caregiver of this when you call.  · There is vaginal spotting, bleeding or passing clots. Tell your caregiver of the amount and how many pads are used.  · You develop a bad smelling vaginal discharge with  a change in the color from clear to white.  · You develop vomiting that lasts more than 24 hours.  · You develop chills or fever.  · You develop shortness of breath.  · You develop burning on urination.  · You loose more than 2 pounds of weight   or gain more than 2 pounds of weight or as suggested by your caregiver.  · You notice sudden swelling of your face, hands, and feet or legs.  · You develop belly (abdominal) pain. Round ligament discomfort is a common non-cancerous (benign) cause of abdominal pain in pregnancy. Your caregiver still must evaluate you.  · You develop a severe headache that does not go away.  · You develop visual problems, blurred or double vision.  · If you have not felt your baby move for more than 1 hour. If you think the baby is not moving as much as usual, eat something with sugar in it and lie down on your left side for an hour. The baby should move at least 4 to 5 times per hour. Call right away if your baby moves less than that.  · You fall, are in a car accident or any kind of trauma.  · There is mental or physical violence at home.  Document Released: 08/03/2001 Document Revised: 11/01/2011 Document Reviewed: 02/05/2009  ExitCare® Patient Information ©2013 [employer].

## 2012-12-13 ENCOUNTER — Encounter: Payer: Self-pay | Admitting: Obstetrics & Gynecology

## 2012-12-13 ENCOUNTER — Ambulatory Visit (INDEPENDENT_AMBULATORY_CARE_PROVIDER_SITE_OTHER): Payer: Medicaid Other | Admitting: Obstetrics & Gynecology

## 2012-12-13 VITALS — BP 110/80 | Wt 179.0 lb

## 2012-12-13 DIAGNOSIS — Z3480 Encounter for supervision of other normal pregnancy, unspecified trimester: Secondary | ICD-10-CM

## 2012-12-13 DIAGNOSIS — O99320 Drug use complicating pregnancy, unspecified trimester: Secondary | ICD-10-CM

## 2012-12-13 DIAGNOSIS — O98519 Other viral diseases complicating pregnancy, unspecified trimester: Secondary | ICD-10-CM

## 2012-12-13 DIAGNOSIS — O99340 Other mental disorders complicating pregnancy, unspecified trimester: Secondary | ICD-10-CM

## 2012-12-13 DIAGNOSIS — F1920 Other psychoactive substance dependence, uncomplicated: Secondary | ICD-10-CM

## 2012-12-13 DIAGNOSIS — Z1389 Encounter for screening for other disorder: Secondary | ICD-10-CM

## 2012-12-13 DIAGNOSIS — Z331 Pregnant state, incidental: Secondary | ICD-10-CM

## 2012-12-13 DIAGNOSIS — O09299 Supervision of pregnancy with other poor reproductive or obstetric history, unspecified trimester: Secondary | ICD-10-CM

## 2012-12-13 LAB — POCT URINALYSIS DIPSTICK
Blood, UA: NEGATIVE
Glucose, UA: NEGATIVE
Nitrite, UA: NEGATIVE

## 2012-12-13 MED ORDER — ACYCLOVIR 400 MG PO TABS: 400.0000 mg | ORAL_TABLET | Freq: Three times a day (TID) | ORAL | Status: DC

## 2012-12-13 NOTE — Progress Notes (Signed)
LEGS going to sleep and having spasm.

## 2012-12-13 NOTE — Progress Notes (Signed)
BP weight and urine results all reviewed and noted. Patient reports good fetal movement, denies any bleeding and no rupture of membranes symptoms or regular contractions. Patient is without complaints. All questions were answered.  

## 2012-12-13 NOTE — Patient Instructions (Signed)
Epidural Risks and Benefits The continuous putting in (infusion) of local anesthetics through a long, narrow, hollow plastic tube (catheter)/needle into the lower (lumbar) area of your spine is commonly called an epidural. This means outside the covering of the spinal cord. The epidural catheter is placed in the space on the outside of the membrane that covers the spinal cord. The anesthetic medicine numbs the nerves of the spinal cord in the epidural space. There is also a spinal/epidural anesthetic using two needles and a catheter. The medication is first placed in the spinal canal. Then that needle is removed and a catheter is placed in the epidural space through the second needle for continuous anesthesia. This seems to be the most popular type of regional anesthesia used now. This is sometimes given for pain management to women who are giving birth. Spinal and epidural anesthesia are called regional anesthesia because they numb a certain region of the body. While it is an effective pain management tool, some reasons not to use this include:  Restricted mobility: The tubes and monitors connected to you do not allow for much moving around.  Increased likelihood of bladder catheterization, oxytocin administration, and internal monitoring. This means a tube (catheter) may have to be put into the bladder to drain the urine. Uterine contractions can become weaker and less frequent. They also may have a higher use of oxytocin than mothers not having regional anesthesia.  Increased likelihood of operative delivery: This includes the use of or need for forceps, vacuum extractor, episiotomy, or cesarean delivery. When the dose is too large, or when it sinks down into the "tailbone" (sacral) region of the body, the perineum and the birth canal (vagina) are anesthetized. Anesthetic is injected into this area late in labor to deaden all sensation. When it "accidentally" happens earlier in labor, the muscles of the  pelvic floor are relaxed too early. This interferes with the normal flexion and rotation of the baby's head as it passes through the birth canal. This interference can lead to abnormal presentations that are more dangerous for the baby.  Must use an automatic blood pressure cuff throughout labor. This is a cuff that automatically takes your blood pressure at regular intervals. SHORT TERM MATERNAL RISKS  Dural puncture - The dura is one of the membranes surrounding the spinal cord. If the anesthetic medication gets into the spinal canal through a dural puncture, it can result in a spinal anesthetic and spinal headache. Spinal headaches are treated with an epidural blood patch to cover the punctured area.  Low blood pressure (hypotension) - Nearly one third of women with an epidural will develop low blood pressure. The ways that patients must lay during the epidural can make this worse. Their position is limited because they will be unable to move their legs easily for the time of the anesthetic. Low blood pressure is also a risk for the baby. If the baby does not get enough oxygen from the mom's blood, it can result in an emergency Cesarean section. This means the baby is delivered by an operation through a cut by the surgeon (incision) on the belly of the mother.  Nausea, vomiting, and prolonged shivering.  Prolonged labor - With large doses of anesthetic medication, the patient loses the desire and the ability to bear down and push. This results in an increased use of forceps and vacuum extractions, compared to women having unmedicated deliveries.  Uneven, incomplete or non-existent pain relief. Sometimes the epidural does not work well and   additional medications may be needed for pain relief.  Difficulty breathing well or paralysis if the level of anesthesia goes too high in the spine.  Convulsions - If the anesthetic agent accidentally is injected into a blood vessel it can cause convulsions and  loss of consciousness.  Toxic drug reactions.  Septic meningitis - An abscess can form at the site where the epidural catheter is placed. If this spreads into the spinal canal it can cause meningitis.  Allergic reaction - This causes blood pressure to become too low and other medications and fluids must be given to bring the blood pressure up. Also rashes and difficulty breathing may develop.  Cardiac arrest - This is rare but real threat to the life of the mother and baby.  Fever is common.  Itching that is easily treated.  Spinal hematoma. LONG TERM MATERNAL RISKS  Neurological complications - A nerve problem called Horner's syndrome can develop with epidural anesthesia for vaginal delivery. It is impossible to predict which patients will develop a Horner's syndrome. Even the nerves to the face can be blocked, temporarily or permanently. Tremors and shakes can occur.  Paresthesia ("pins and needles"). This is a feeling that comes from inflammation of a nerve.  Dizziness and fainting can become a problem after epidurals. This is usually only for a couple of days. RISKS TO BABY  Direct drug toxicity.  Fetal distress, abnormal fetal heart rate (FHR) (can lead to emergency cesarean). This is especially true if the anesthetic gets into the mother's blood stream or too much medication is put into the epidural. REASONS NOT TO HAVE EPIDURAL ANESTHESIA  Increased costs.  The mother has a low blood pressure.  There are blood clotting problems.  A brain tumor is present.  There is an infection in the blood stream.  A skin infection at the needle site.  A tattoo at the needle site. BENEFITS  Regional anesthesia is the most effective pain relief for labor and delivery.  It is the best anesthetic for preeclampsia and eclampsia.  There is better pain control after delivery (vaginal or cesarean).  When done correctly, no medication gets to the baby.  Sooner ambulation after  delivery.  It can be left in place during all of labor.  You can be awake during a Cesarean delivery and see the baby immediately after delivery. AFTER THE PROCEDURE   You will be kept in bed for several hours to prevent headaches.  You will be kept in bed until your legs are no longer numb and it is safe to walk.  The length of time you spend in the hospital will depend on the type of surgery or procedure you have had.  The epidural catheter is removed after you no longer need it for pain. HOME CARE INSTRUCTIONS   Do not drive or operate any kind of machinery for at least 24 hours. Make sure there is someone to drive you home.  Do not drink alcohol for at least 24 hours after the anesthesia.  Do not make important decisions for at least 24 hours after the anesthesia.  Drink lots of fluids.  Return to your normal diet.  Keep all your postoperative appointments as scheduled. SEEK IMMEDIATE MEDICAL CARE IF:  You develop a fever or temperature over 98.6 F (37 C).  You have a persistent headache.  You develop dizziness, fainting or lightheadedness.  You develop weakness, numbness or tingling in your arms or legs.  You have a skin rash.  You   have difficulty breathing  You have a stiff neck with or without stiff back.  You develop chest pain. Document Released: 08/09/2005 Document Revised: 11/01/2011 Document Reviewed: 09/16/2008 ExitCare Patient Information 2013 [employer].  

## 2012-12-27 ENCOUNTER — Ambulatory Visit (INDEPENDENT_AMBULATORY_CARE_PROVIDER_SITE_OTHER): Payer: Medicaid Other | Admitting: Advanced Practice Midwife

## 2012-12-27 ENCOUNTER — Encounter: Payer: Self-pay | Admitting: Advanced Practice Midwife

## 2012-12-27 VITALS — BP 124/70 | Wt 182.0 lb

## 2012-12-27 DIAGNOSIS — Z1389 Encounter for screening for other disorder: Secondary | ICD-10-CM

## 2012-12-27 DIAGNOSIS — O09299 Supervision of pregnancy with other poor reproductive or obstetric history, unspecified trimester: Secondary | ICD-10-CM

## 2012-12-27 DIAGNOSIS — O99340 Other mental disorders complicating pregnancy, unspecified trimester: Secondary | ICD-10-CM

## 2012-12-27 DIAGNOSIS — Z331 Pregnant state, incidental: Secondary | ICD-10-CM

## 2012-12-27 DIAGNOSIS — F1920 Other psychoactive substance dependence, uncomplicated: Secondary | ICD-10-CM

## 2012-12-27 DIAGNOSIS — Z3483 Encounter for supervision of other normal pregnancy, third trimester: Secondary | ICD-10-CM

## 2012-12-27 DIAGNOSIS — N390 Urinary tract infection, site not specified: Secondary | ICD-10-CM

## 2012-12-27 DIAGNOSIS — O98519 Other viral diseases complicating pregnancy, unspecified trimester: Secondary | ICD-10-CM

## 2012-12-27 LAB — POCT URINALYSIS DIPSTICK
Glucose, UA: NEGATIVE
Nitrite, UA: POSITIVE

## 2012-12-27 NOTE — Progress Notes (Signed)
+   Nitrites.  Rx macrobid 100mg  BID x 7 days.  Will culture urine.    No c/o at this time except back pain. Try maternity belt  Routine questions about pregnancy answered.  F/U in 2 weeks for LROB/GBS. Doesn't feel like she's going to make it to her due date

## 2012-12-27 NOTE — Assessment & Plan Note (Signed)
Clinic:[facility]  Genetic Screen NT:     normal                    Quad Screen/MSAFP:normal  Anatomic [place] normal  Glucose Screen normal  GBS   Feeding Preference bottle  Contraception depo  Circumcision Yes at [facility]

## 2012-12-27 NOTE — Progress Notes (Signed)
Lower pelvic pain. Goes to [facility]. Pressure in lower back.

## 2012-12-29 LAB — URINE CULTURE: Colony Count: >=100000

## 2013-01-08 ENCOUNTER — Encounter: Payer: Self-pay | Admitting: Obstetrics & Gynecology

## 2013-01-08 ENCOUNTER — Ambulatory Visit (INDEPENDENT_AMBULATORY_CARE_PROVIDER_SITE_OTHER): Payer: Medicaid Other | Admitting: Obstetrics & Gynecology

## 2013-01-08 VITALS — BP 130/80 | Wt 189.0 lb

## 2013-01-08 DIAGNOSIS — Z331 Pregnant state, incidental: Secondary | ICD-10-CM

## 2013-01-08 DIAGNOSIS — O98519 Other viral diseases complicating pregnancy, unspecified trimester: Secondary | ICD-10-CM

## 2013-01-08 DIAGNOSIS — Z1389 Encounter for screening for other disorder: Secondary | ICD-10-CM

## 2013-01-08 DIAGNOSIS — Z3483 Encounter for supervision of other normal pregnancy, third trimester: Secondary | ICD-10-CM

## 2013-01-08 DIAGNOSIS — O99019 Anemia complicating pregnancy, unspecified trimester: Secondary | ICD-10-CM

## 2013-01-08 DIAGNOSIS — O09299 Supervision of pregnancy with other poor reproductive or obstetric history, unspecified trimester: Secondary | ICD-10-CM

## 2013-01-08 DIAGNOSIS — O99340 Other mental disorders complicating pregnancy, unspecified trimester: Secondary | ICD-10-CM

## 2013-01-08 LAB — POCT URINALYSIS DIPSTICK
Blood, UA: 2
Glucose, UA: NEGATIVE
Ketones, UA: NEGATIVE

## 2013-01-08 NOTE — Progress Notes (Signed)
BP weight and urine results all reviewed and noted. Patient reports good fetal movement, denies any bleeding and no rupture of membranes symptoms or regular contractions. Patient is without complaints. All questions were answered.  

## 2013-01-08 NOTE — Progress Notes (Signed)
VAGINAL DISCHARGE ,COLOR YELLOWISH -GREEN.

## 2013-01-10 ENCOUNTER — Encounter: Payer: Medicaid Other | Admitting: Obstetrics & Gynecology

## 2013-01-17 ENCOUNTER — Encounter: Payer: Self-pay | Admitting: Obstetrics & Gynecology

## 2013-01-17 ENCOUNTER — Ambulatory Visit (INDEPENDENT_AMBULATORY_CARE_PROVIDER_SITE_OTHER): Payer: Medicaid Other | Admitting: Obstetrics & Gynecology

## 2013-01-17 VITALS — BP 136/76 | Wt 189.0 lb

## 2013-01-17 DIAGNOSIS — Z1389 Encounter for screening for other disorder: Secondary | ICD-10-CM

## 2013-01-17 DIAGNOSIS — O99019 Anemia complicating pregnancy, unspecified trimester: Secondary | ICD-10-CM

## 2013-01-17 DIAGNOSIS — O09299 Supervision of pregnancy with other poor reproductive or obstetric history, unspecified trimester: Secondary | ICD-10-CM

## 2013-01-17 DIAGNOSIS — O2390 Unspecified genitourinary tract infection in pregnancy, unspecified trimester: Secondary | ICD-10-CM

## 2013-01-17 DIAGNOSIS — N390 Urinary tract infection, site not specified: Secondary | ICD-10-CM

## 2013-01-17 DIAGNOSIS — Z3483 Encounter for supervision of other normal pregnancy, third trimester: Secondary | ICD-10-CM

## 2013-01-17 DIAGNOSIS — Z331 Pregnant state, incidental: Secondary | ICD-10-CM

## 2013-01-17 LAB — POCT URINALYSIS DIPSTICK
Glucose, UA: NEGATIVE
Ketones, UA: NEGATIVE

## 2013-01-17 MED ORDER — NITROFURANTOIN MONOHYD MACRO 100 MG PO CAPS: 100.0000 mg | ORAL_CAPSULE | Freq: Two times a day (BID) | ORAL | Status: DC

## 2013-01-17 NOTE — Progress Notes (Addendum)
Pt unable to get urine sample at first but was able to get one before she left. Urine was positive for leuk, nitrites, protein, and blood. Urine sent to lab for culture. Spoke with Dr. Despina Hidden. Macrobid e prescribed to [facility] in [place].

## 2013-01-17 NOTE — Addendum Note (Signed)
Addended by: Colen Darling on: 01/17/2013 03:20 PM   Modules accepted: Orders

## 2013-01-17 NOTE — Progress Notes (Signed)
BP weight and urine results all reviewed and noted. Patient reports good fetal movement, denies any bleeding and no rupture of membranes symptoms or regular contractions. Patient is without complaints. All questions were answered.  

## 2013-01-17 NOTE — Addendum Note (Signed)
Addended by: Lazaro Arms on: 01/17/2013 03:25 PM   Modules accepted: Orders

## 2013-01-19 LAB — URINE CULTURE

## 2013-01-22 ENCOUNTER — Encounter: Payer: Medicaid Other | Admitting: Obstetrics & Gynecology

## 2013-01-26 ENCOUNTER — Inpatient Hospital Stay (HOSPITAL_COMMUNITY)
Admission: AD | Admit: 2013-01-26 | Discharge: 2013-01-30 | DRG: 774 | Disposition: A | Discharge status: Home / Self Care | Payer: Medicaid Other | Source: Ambulatory Visit | Attending: Family Medicine | Admitting: Family Medicine

## 2013-01-26 ENCOUNTER — Encounter (HOSPITAL_COMMUNITY): Payer: Self-pay

## 2013-01-26 ENCOUNTER — Ambulatory Visit (INDEPENDENT_AMBULATORY_CARE_PROVIDER_SITE_OTHER): Payer: Medicaid Other | Admitting: Obstetrics and Gynecology

## 2013-01-26 ENCOUNTER — Encounter: Payer: Medicaid Other | Admitting: Obstetrics & Gynecology

## 2013-01-26 VITALS — BP 146/98 | Wt 191.2 lb

## 2013-01-26 DIAGNOSIS — Z331 Pregnant state, incidental: Secondary | ICD-10-CM

## 2013-01-26 DIAGNOSIS — Z22330 Carrier of Group B streptococcus: Secondary | ICD-10-CM

## 2013-01-26 DIAGNOSIS — O2340 Unspecified infection of urinary tract in pregnancy, unspecified trimester: Secondary | ICD-10-CM

## 2013-01-26 DIAGNOSIS — O139 Gestational [pregnancy-induced] hypertension without significant proteinuria, unspecified trimester: Secondary | ICD-10-CM

## 2013-01-26 DIAGNOSIS — IMO0002 Reserved for concepts with insufficient information to code with codable children: Principal | ICD-10-CM | POA: Diagnosis present

## 2013-01-26 DIAGNOSIS — Z1389 Encounter for screening for other disorder: Secondary | ICD-10-CM

## 2013-01-26 DIAGNOSIS — O133 Gestational [pregnancy-induced] hypertension without significant proteinuria, third trimester: Secondary | ICD-10-CM

## 2013-01-26 DIAGNOSIS — O2390 Unspecified genitourinary tract infection in pregnancy, unspecified trimester: Secondary | ICD-10-CM

## 2013-01-26 DIAGNOSIS — O09299 Supervision of pregnancy with other poor reproductive or obstetric history, unspecified trimester: Secondary | ICD-10-CM

## 2013-01-26 DIAGNOSIS — O99892 Other specified diseases and conditions complicating childbirth: Secondary | ICD-10-CM | POA: Diagnosis present

## 2013-01-26 DIAGNOSIS — Z3483 Encounter for supervision of other normal pregnancy, third trimester: Secondary | ICD-10-CM

## 2013-01-26 LAB — COMPREHENSIVE METABOLIC PANEL
ALT: 7 U/L (ref 0–35)
BUN: 12 mg/dL (ref 6–23)
CO2: 23 mEq/L (ref 19–32)
Calcium: 8.9 mg/dL (ref 8.4–10.5)
Creatinine, Ser: 0.67 mg/dL (ref 0.50–1.10)
GFR calc Af Amer: >90 mL/min (ref >90)
GFR calc non Af Amer: >90 mL/min (ref >90)
Glucose, Bld: 83 mg/dL (ref 70–99)

## 2013-01-26 LAB — POCT URINALYSIS DIPSTICK
Glucose, UA: NEGATIVE
Ketones, UA: NEGATIVE
Nitrite, UA: POSITIVE

## 2013-01-26 LAB — CBC
Hemoglobin: 10.3 g/dL — ABNORMAL LOW (ref 12.0–15.0)
MCH: 31.9 pg (ref 26.0–34.0)
MCV: 92 fL (ref 78.0–100.0)
RBC: 3.23 MIL/uL — ABNORMAL LOW (ref 3.87–5.11)

## 2013-01-26 MED ORDER — OXYTOCIN 40 UNITS IN LACTATED RINGERS INFUSION - SIMPLE MED
1.0000 m[IU]/min | INTRAVENOUS | Status: DC
  Administered 2013-01-26: 2 m[IU]/min via INTRAVENOUS
  Filled 2013-01-26: qty 1000

## 2013-01-26 MED ORDER — ACETAMINOPHEN 325 MG PO TABS: 650.0000 mg | ORAL_TABLET | ORAL | Status: DC | PRN

## 2013-01-26 MED ORDER — LIDOCAINE HCL (PF) 1 % IJ SOLN
30.0000 mL | INTRAMUSCULAR | Status: DC | PRN
  Filled 2013-01-26: qty 30

## 2013-01-26 MED ORDER — ONDANSETRON HCL 4 MG/2ML IJ SOLN
4.0000 mg | Freq: Four times a day (QID) | INTRAMUSCULAR | Status: DC | PRN
  Administered 2013-01-27 (×2): 4 mg via INTRAVENOUS
  Filled 2013-01-26 (×2): qty 2

## 2013-01-26 MED ORDER — OXYTOCIN 40 UNITS IN LACTATED RINGERS INFUSION - SIMPLE MED: 62.5000 mL/h | INTRAVENOUS | Status: DC

## 2013-01-26 MED ORDER — TERBUTALINE SULFATE 1 MG/ML IJ SOLN: 0.2500 mg | Freq: Once | INTRAMUSCULAR | Status: AC | PRN

## 2013-01-26 MED ORDER — PENICILLIN G POTASSIUM 5000000 UNITS IJ SOLR
5.0000 10*6.[IU] | Freq: Once | INTRAVENOUS | Status: AC
  Administered 2013-01-26: 5 10*6.[IU] via INTRAVENOUS
  Filled 2013-01-26: qty 5

## 2013-01-26 MED ORDER — OXYCODONE-ACETAMINOPHEN 5-325 MG PO TABS: 1.0000 | ORAL_TABLET | ORAL | Status: DC | PRN

## 2013-01-26 MED ORDER — MAGNESIUM SULFATE 40 G IN LACTATED RINGERS - SIMPLE: 2.0000 g/h | INTRAVENOUS | Status: DC

## 2013-01-26 MED ORDER — IBUPROFEN 600 MG PO TABS
600.0000 mg | ORAL_TABLET | Freq: Four times a day (QID) | ORAL | Status: DC | PRN
  Administered 2013-01-27: 600 mg via ORAL
  Filled 2013-01-26: qty 1

## 2013-01-26 MED ORDER — CITRIC ACID-SODIUM CITRATE 334-500 MG/5ML PO SOLN: 30.0000 mL | ORAL | Status: DC | PRN

## 2013-01-26 MED ORDER — ACETAMINOPHEN 500 MG PO TABS
1000.0000 mg | ORAL_TABLET | Freq: Once | ORAL | Status: AC
  Administered 2013-01-26: 1000 mg via ORAL
  Filled 2013-01-26: qty 2

## 2013-01-26 MED ORDER — MAGNESIUM SULFATE BOLUS VIA INFUSION
4.0000 g | Freq: Once | INTRAVENOUS | Status: DC
  Filled 2013-01-26: qty 500

## 2013-01-26 MED ORDER — MAGNESIUM SULFATE 40 G IN LACTATED RINGERS - SIMPLE
2.0000 g/h | INTRAVENOUS | Status: DC
  Administered 2013-01-26: 4 g/h via INTRAVENOUS
  Administered 2013-01-27: 2 g/h via INTRAVENOUS
  Filled 2013-01-26 (×2): qty 500

## 2013-01-26 MED ORDER — PENICILLIN G POTASSIUM 5000000 UNITS IJ SOLR
2.5000 10*6.[IU] | INTRAVENOUS | Status: DC
  Administered 2013-01-27 (×5): 2.5 10*6.[IU] via INTRAVENOUS
  Filled 2013-01-26 (×8): qty 2.5

## 2013-01-26 MED ORDER — OXYTOCIN BOLUS FROM INFUSION
500.0000 mL | INTRAVENOUS | Status: DC
  Administered 2013-01-27: 500 mL via INTRAVENOUS

## 2013-01-26 MED ORDER — MAGNESIUM SULFATE BOLUS VIA INFUSION: 4.0000 g | Freq: Once | INTRAVENOUS | Status: DC

## 2013-01-26 MED ORDER — LACTATED RINGERS IV SOLN
INTRAVENOUS | Status: DC
  Administered 2013-01-26: 125 mL/h via INTRAVENOUS
  Administered 2013-01-27: 14:00:00 via INTRAVENOUS

## 2013-01-26 MED ORDER — LACTATED RINGERS IV SOLN: 500.0000 mL | INTRAVENOUS | Status: DC | PRN

## 2013-01-26 MED ORDER — NALBUPHINE SYRINGE 5 MG/0.5 ML
10.0000 mg | INJECTION | INTRAMUSCULAR | Status: DC | PRN
  Administered 2013-01-26 – 2013-01-27 (×5): 10 mg via INTRAVENOUS
  Filled 2013-01-26: qty 1
  Filled 2013-01-26: qty 0.5
  Filled 2013-01-26 (×3): qty 1
  Filled 2013-01-26: qty 0.5
  Filled 2013-01-26: qty 1

## 2013-01-26 NOTE — Patient Instructions (Signed)
Report to [facility] hospital for probable Induction of labor. Will be seen in MAU if L& D full.

## 2013-01-26 NOTE — Progress Notes (Signed)
Moderate h/a x 1wk, +"swimmy headed" denies ruq pain, reflexes 2+, cervix 2-3/25/-2 vtx,  Urine Cult last wk was + for Ecoli uti, S to Macrobid was rx'd . NO cvat. Will sent to [facility] for IOL for gest HTN, r/o preeclampsia. L&D full, will route to MAU. Case discussed with D Poe CNM.

## 2013-01-26 NOTE — MAU Provider Note (Signed)
Chart reviewed and agree with management and plan.  

## 2013-01-26 NOTE — MAU Note (Signed)
Lab in at this time,  urine spec sent, back to room and BP applied

## 2013-01-26 NOTE — H&P (Signed)
Adrienne Crawford is a 22 y.o. female  G3P1011 at [redacted]w[redacted]d who was sent from [facility] this am at [facility] due to elevated BPs and dipstick proteinuria. On antibiotic for ASB. Has a mild H/A rated 5/10. Denies visual disturbance, epigastric pain, N/V. Hx of preE.  Mild H/A resolved with Tylenol in MAU. On abx for ASB.  Denies contractions, leakage of fluid or vaginal bleeding. Good fetal movement.     Maternal Medical History:  Reason for admission: Nausea.    OB History   Grav Para Term Preterm Abortions TAB SAB Ect Mult Living   3 1 1  1  1   1      Past Medical History  Diagnosis Date  . UTI (urinary tract infection)   . Asthma   . PID (acute pelvic inflammatory disease)   . Pregnant   . Anxiety   . Panic attack   . Depression    Past Surgical History  Procedure Laterality Date  . Bladder surgery     Family History: family history includes Cancer in her other; Hypertension in her other; and Stroke in her other. Social History:  reports that she has quit smoking. Her smoking use included Cigarettes. She smoked 0.50 packs per day. She does not have any smokeless tobacco history on file. She reports that  drinks alcohol. She reports that she does not use illicit drugs.   Prenatal Transfer Tool  Maternal Diabetes: No Genetic Screening: Normal Maternal Ultrasounds/Referrals: Normal Fetal Ultrasounds or other Referrals:  None Maternal Substance Abuse:  Yes:  Type: Marijuana Significant Maternal Medications:  None Significant Maternal Lab Results:  Lab values include: Group B Strep positive Other Comments:  None  Review of Systems  Constitutional: Negative for fever.  Eyes: Negative for blurred vision.  Respiratory: Negative for cough.   Cardiovascular: Negative for chest pain.  Gastrointestinal: Negative for nausea and vomiting.  Genitourinary: Negative for dysuria.  Skin: Negative for rash.  Neurological: Positive for headaches. Negative for dizziness.   Psychiatric/Behavioral: Negative for depression.    Dilation: 2.5 Effacement (%): Thick Station: -3 Exam by:: D. Tasnia Spegal, CNM Blood pressure 133/80, pulse 69, resp. rate 18, last menstrual period 04/19/2012. Maternal Exam:  Uterine Assessment: Contraction strength is mild.  Contraction duration is 50 seconds. Contraction frequency is irregular.   Abdomen: Patient reports no abdominal tenderness. Fundal height is term.   Estimated fetal weight is 7#.    Introitus: Normal vulva. Normal vagina.  Vagina is negative for discharge.  Ferning test: not done.  Nitrazine test: not done. Amniotic fluid character: not assessed.  Pelvis: adequate for delivery.   Cervix: Cervix evaluated by digital exam.   2-3/thick/-2  Fetal Exam Fetal Monitor Review: Mode: ultrasound.   Baseline rate: 130.  Variability: moderate (6-25 bpm).   Pattern: accelerations present and no decelerations.    Fetal State Assessment: Category I - tracings are normal.     Physical Exam  Constitutional: She is oriented to person, place, and time. She appears well-developed and well-nourished. No distress.  HENT:  Head: Normocephalic.  Eyes: Pupils are equal, round, and reactive to light.  Neck: Normal range of motion.  Cardiovascular: Normal rate, regular rhythm and normal heart sounds.   Respiratory: Effort normal and breath sounds normal.  GI: Soft.  Genitourinary: Vagina normal and uterus normal. No vaginal discharge found.  Musculoskeletal: Normal range of motion.  Neurological: She is alert and oriented to person, place, and time. She has normal reflexes.  Skin: Skin is warm  and dry.  Psychiatric: She has a normal mood and affect. Her behavior is normal.    Prenatal labs: ABO, Rh: A/Positive/-- (11/19 0000) Antibody: NEG (03/28 0929) Rubella: Immune (11/19 0000) RPR: NON REAC (03/28 0929)  HBsAg: Negative (11/19 0000)  HIV: NON REACTIVE (03/28 0929)  GBS: POSITIVE (05/19 1643)    Assessment/Plan: Admit for IOL indicated by preeclampsia PCN for GBS prophylaxis MgSO4 for sz prophylaxis Pitocin for IOL Dr. Erin Fulling aware   Adrienne Crawford 01/26/2013, 6:05 PM

## 2013-01-26 NOTE — Progress Notes (Signed)
C/o numbness right arm, Bilateral swelling legs and feet. Urine today + Nitrates. Blood pressure 164/110, left arm and 154/108 right arm, frequent headaches

## 2013-01-26 NOTE — MAU Provider Note (Signed)
Chief Complaint:  No chief complaint on file.       HPI: Adrienne Crawford is a 22 y.o. G3P1011 at [redacted]w[redacted]d who was sent from [facility] this am at [facility] due to elevated BPs and dipstick proteinuria. On antibiotic for ASB. Has a mild H/A rated 5/10. Denies visual disturbance, epigastric pain, N/V. Hx of preE.  Denies contractions, leakage of fluid or vaginal bleeding. Good fetal movement.   Pregnancy Course: Frequent UTI/ASB, small bladder, on UTI suppressive theray  Past Medical History: Past Medical History  Diagnosis Date  . UTI (urinary tract infection)   . Asthma   . PID (acute pelvic inflammatory disease)   . Pregnant   . Anxiety   . Panic attack   . Depression     Past obstetric history: OB History   Grav Para Term Preterm Abortions TAB SAB Ect Mult Living   3 1 1  1  1   1      # Outc Date GA Lbr Len/2nd Wgt Sex Del Anes PTL Lv   1 TRM 2011 [redacted]w[redacted]d   M SVD EPI  Yes   Comments: IOL ?pre-eclampsia   2 SAB 7/13           3 CUR               Past Surgical History: Past Surgical History  Procedure Laterality Date  . Bladder surgery      Family History: Family History  Problem Relation Age of Onset  . Cancer Other     Lung, Throat annd Mouth  . Hypertension Other   . Stroke Other     Social History: History  Substance Use Topics  . Smoking status: Former Smoker -- 0.50 packs/day    Types: Cigarettes  . Smokeless tobacco: Not on file     Comment: Quit smoking with positive preg. test.  . Alcohol Use: Yes     Comment: On weekends, but quit with positive preg. test.    Allergies:  Allergies  Allergen Reactions  . Sulfa Antibiotics Anaphylaxis and Swelling    Meds:  Prescriptions prior to admission  Medication Sig Dispense Refill  . acetaminophen (TYLENOL) 500 MG tablet Take 1,000 mg by mouth every 6 (six) hours as needed. pain      . acyclovir (ZOVIRAX) 400 MG tablet Take 1 tablet (400 mg total) by mouth 3 (three) times daily.  90 tablet  3  . prenatal  vitamin w/FE, FA (PRENATAL 1 + 1) 27-1 MG TABS Take 1 tablet by mouth daily.      . promethazine (PHENERGAN) 25 MG tablet Take 1 tablet (25 mg total) by mouth every 6 (six) hours as needed for nausea.  15 tablet  0    ROS: Pertinent findings in history of present illness.  Physical Exam  Last menstrual period 04/19/2012. GENERAL: Well-developed, well-nourished female in no acute distress.  HEENT: normocephalic HEART: normal rate RESP: normal effort ABDOMEN: Soft, non-tender, gravid appropriate for gestational age EXTREMITIES: Nontender, no edema NEURO: alert and oriented SPECULUM EXAM: NEFG, physiologic discharge, no blood, cervix clean    FHT:  Baseline 140 , moderate variability, accelerations present, no decelerations Contractions: irreg, mild   Labs: Results for orders placed in visit on 01/26/13 (from the past 24 hour(s))  POCT URINALYSIS DIPSTICK     Status: None   Collection Time    01/26/13 12:31 PM      Result Value Range   Color, UA yellow     Clarity,  UA cloudy     Glucose, UA neg     Bilirubin, UA       Ketones, UA neg     Spec Grav, UA       Blood, UA trace     pH, UA       Protein, UA trace     Urobilinogen, UA       Nitrite, UA positive     Leukocytes, UA large (3+)      Assessment: 1. Supervision of other normal pregnancy, third trimester   G1 39.1 wk GHTN  Plan: Admit Danae Orleans, CNM 01/26/2013 4:07 PM

## 2013-01-27 ENCOUNTER — Inpatient Hospital Stay (HOSPITAL_COMMUNITY): Payer: Medicaid Other | Admitting: Anesthesiology

## 2013-01-27 ENCOUNTER — Encounter (HOSPITAL_COMMUNITY): Payer: Self-pay | Admitting: Anesthesiology

## 2013-01-27 DIAGNOSIS — IMO0002 Reserved for concepts with insufficient information to code with codable children: Secondary | ICD-10-CM

## 2013-01-27 DIAGNOSIS — O9989 Other specified diseases and conditions complicating pregnancy, childbirth and the puerperium: Secondary | ICD-10-CM

## 2013-01-27 LAB — DIC (DISSEMINATED INTRAVASCULAR COAGULATION)PANEL
Fibrinogen: 377 mg/dL (ref 204–475)
INR: 1.01 (ref 0.00–1.49)
Prothrombin Time: 13.2 seconds (ref 11.6–15.2)
aPTT: 25 seconds (ref 24–37)

## 2013-01-27 LAB — URINALYSIS, ROUTINE W REFLEX MICROSCOPIC
Bilirubin Urine: NEGATIVE
Ketones, ur: NEGATIVE mg/dL
Nitrite: POSITIVE — AB
Protein, ur: NEGATIVE mg/dL
Urobilinogen, UA: 0.2 mg/dL (ref 0.0–1.0)

## 2013-01-27 LAB — RAPID URINE DRUG SCREEN, HOSP PERFORMED
Amphetamines: NOT DETECTED
Barbiturates: NOT DETECTED
Benzodiazepines: NOT DETECTED
Cocaine: NOT DETECTED
Opiates: NOT DETECTED
Tetrahydrocannabinol: NOT DETECTED

## 2013-01-27 LAB — CBC
MCV: 92.9 fL (ref 78.0–100.0)
Platelets: 134 10*3/uL — ABNORMAL LOW (ref 150–400)
RDW: 12.9 % (ref 11.5–15.5)
WBC: 8.6 10*3/uL (ref 4.0–10.5)

## 2013-01-27 LAB — ABO/RH: ABO/RH(D): A POS

## 2013-01-27 LAB — RPR: RPR Ser Ql: NONREACTIVE

## 2013-01-27 LAB — URINALYSIS, MICROSCOPIC ONLY: Crystals: NONE SEEN

## 2013-01-27 MED ORDER — METHYLERGONOVINE MALEATE 0.2 MG/ML IJ SOLN: 0.2000 mg | Freq: Once | INTRAMUSCULAR | Status: DC

## 2013-01-27 MED ORDER — SODIUM CHLORIDE 0.9 % IV SOLN
250.0000 mL | INTRAVENOUS | Status: DC | PRN
  Administered 2013-01-29: 250 mL via INTRAVENOUS

## 2013-01-27 MED ORDER — PHENYLEPHRINE 40 MCG/ML (10ML) SYRINGE FOR IV PUSH (FOR BLOOD PRESSURE SUPPORT)
80.0000 ug | PREFILLED_SYRINGE | INTRAVENOUS | Status: DC | PRN
  Filled 2013-01-27: qty 2
  Filled 2013-01-27: qty 5

## 2013-01-27 MED ORDER — FENTANYL 2.5 MCG/ML BUPIVACAINE 1/10 % EPIDURAL INFUSION (WH - ANES)
14.0000 mL/h | INTRAMUSCULAR | Status: DC | PRN
  Administered 2013-01-27: 14 mL/h via EPIDURAL
  Filled 2013-01-27 (×2): qty 125

## 2013-01-27 MED ORDER — BISACODYL 10 MG RE SUPP: 10.0000 mg | Freq: Every day | RECTAL | Status: DC | PRN

## 2013-01-27 MED ORDER — BENZOCAINE-MENTHOL 20-0.5 % EX AERO
1.0000 "application " | INHALATION_SPRAY | CUTANEOUS | Status: DC | PRN
  Administered 2013-01-28 – 2013-01-30 (×2): 1 via TOPICAL
  Filled 2013-01-27 (×2): qty 56

## 2013-01-27 MED ORDER — OXYTOCIN 40 UNITS IN LACTATED RINGERS INFUSION - SIMPLE MED
INTRAVENOUS | Status: AC
  Administered 2013-01-27: 40 [IU]
  Filled 2013-01-27: qty 1000

## 2013-01-27 MED ORDER — DIPHENHYDRAMINE HCL 50 MG/ML IJ SOLN: 12.5000 mg | INTRAMUSCULAR | Status: DC | PRN

## 2013-01-27 MED ORDER — SIMETHICONE 80 MG PO CHEW: 80.0000 mg | CHEWABLE_TABLET | ORAL | Status: DC | PRN

## 2013-01-27 MED ORDER — LIDOCAINE HCL (PF) 1 % IJ SOLN
INTRAMUSCULAR | Status: DC | PRN
  Administered 2013-01-27 (×2): 8 mL

## 2013-01-27 MED ORDER — OXYCODONE-ACETAMINOPHEN 5-325 MG PO TABS
1.0000 | ORAL_TABLET | ORAL | Status: DC | PRN
  Administered 2013-01-28 (×2): 2 via ORAL
  Administered 2013-01-28: 1 via ORAL
  Administered 2013-01-29 – 2013-01-30 (×7): 2 via ORAL
  Filled 2013-01-27 (×2): qty 2
  Filled 2013-01-27: qty 1
  Filled 2013-01-27 (×7): qty 2

## 2013-01-27 MED ORDER — LANOLIN HYDROUS EX OINT: TOPICAL_OINTMENT | CUTANEOUS | Status: DC | PRN

## 2013-01-27 MED ORDER — CARBOPROST TROMETHAMINE 250 MCG/ML IM SOLN: 250.0000 ug | INTRAMUSCULAR | Status: DC | PRN

## 2013-01-27 MED ORDER — SENNOSIDES-DOCUSATE SODIUM 8.6-50 MG PO TABS
2.0000 | ORAL_TABLET | Freq: Every day | ORAL | Status: DC
  Administered 2013-01-28 – 2013-01-29 (×2): 2 via ORAL

## 2013-01-27 MED ORDER — FLEET ENEMA 7-19 GM/118ML RE ENEM: 1.0000 | ENEMA | Freq: Every day | RECTAL | Status: DC | PRN

## 2013-01-27 MED ORDER — EPHEDRINE 5 MG/ML INJ
10.0000 mg | INTRAVENOUS | Status: DC | PRN
  Filled 2013-01-27: qty 2
  Filled 2013-01-27: qty 4

## 2013-01-27 MED ORDER — OXYTOCIN 40 UNITS IN LACTATED RINGERS INFUSION - SIMPLE MED: 62.5000 mL/h | INTRAVENOUS | Status: DC | PRN

## 2013-01-27 MED ORDER — IBUPROFEN 600 MG PO TABS
600.0000 mg | ORAL_TABLET | Freq: Four times a day (QID) | ORAL | Status: DC
  Administered 2013-01-28 – 2013-01-30 (×8): 600 mg via ORAL
  Filled 2013-01-27 (×9): qty 1

## 2013-01-27 MED ORDER — EPHEDRINE 5 MG/ML INJ
10.0000 mg | INTRAVENOUS | Status: DC | PRN
  Filled 2013-01-27: qty 2

## 2013-01-27 MED ORDER — FENTANYL CITRATE 0.05 MG/ML IJ SOLN
INTRAMUSCULAR | Status: AC
  Filled 2013-01-27: qty 2

## 2013-01-27 MED ORDER — ONDANSETRON HCL 4 MG/2ML IJ SOLN: 4.0000 mg | INTRAMUSCULAR | Status: DC | PRN

## 2013-01-27 MED ORDER — ZOLPIDEM TARTRATE 5 MG PO TABS: 5.0000 mg | ORAL_TABLET | Freq: Every evening | ORAL | Status: DC | PRN

## 2013-01-27 MED ORDER — OXYTOCIN 40 UNITS IN LACTATED RINGERS INFUSION - SIMPLE MED: 250.0000 mL/h | INTRAVENOUS | Status: DC

## 2013-01-27 MED ORDER — WITCH HAZEL-GLYCERIN EX PADS: 1.0000 "application " | MEDICATED_PAD | CUTANEOUS | Status: DC | PRN

## 2013-01-27 MED ORDER — METHYLERGONOVINE MALEATE 0.2 MG/ML IJ SOLN
INTRAMUSCULAR | Status: AC
  Filled 2013-01-27: qty 1

## 2013-01-27 MED ORDER — TETANUS-DIPHTH-ACELL PERTUSSIS 5-2.5-18.5 LF-MCG/0.5 IM SUSP
0.5000 mL | Freq: Once | INTRAMUSCULAR | Status: AC
  Administered 2013-01-28: 0.5 mL via INTRAMUSCULAR
  Filled 2013-01-27 (×2): qty 0.5

## 2013-01-27 MED ORDER — ONDANSETRON HCL 4 MG PO TABS: 4.0000 mg | ORAL_TABLET | ORAL | Status: DC | PRN

## 2013-01-27 MED ORDER — PRENATAL MULTIVITAMIN CH
1.0000 | ORAL_TABLET | Freq: Every day | ORAL | Status: DC
  Administered 2013-01-29: 1 via ORAL
  Filled 2013-01-27: qty 1

## 2013-01-27 MED ORDER — SODIUM CHLORIDE 0.9 % IJ SOLN: 3.0000 mL | INTRAMUSCULAR | Status: DC | PRN

## 2013-01-27 MED ORDER — MISOPROSTOL 200 MCG PO TABS: 800.0000 ug | ORAL_TABLET | Freq: Once | ORAL | Status: DC

## 2013-01-27 MED ORDER — LACTATED RINGERS IV SOLN: 500.0000 mL | Freq: Once | INTRAVENOUS | Status: DC

## 2013-01-27 MED ORDER — MAGNESIUM SULFATE 40 G IN LACTATED RINGERS - SIMPLE
2.0000 g/h | INTRAVENOUS | Status: AC
  Administered 2013-01-28: 2 g/h via INTRAVENOUS
  Filled 2013-01-27 (×2): qty 500

## 2013-01-27 MED ORDER — FENTANYL 2.5 MCG/ML BUPIVACAINE 1/10 % EPIDURAL INFUSION (WH - ANES)
INTRAMUSCULAR | Status: DC | PRN
  Administered 2013-01-27: 14 mL/h via EPIDURAL

## 2013-01-27 MED ORDER — MISOPROSTOL 200 MCG PO TABS
ORAL_TABLET | ORAL | Status: AC
  Administered 2013-01-27: 1000 ug via RECTAL
  Filled 2013-01-27: qty 5

## 2013-01-27 MED ORDER — DIPHENHYDRAMINE HCL 25 MG PO CAPS: 25.0000 mg | ORAL_CAPSULE | Freq: Four times a day (QID) | ORAL | Status: DC | PRN

## 2013-01-27 MED ORDER — SODIUM CHLORIDE 0.9 % IJ SOLN
3.0000 mL | Freq: Two times a day (BID) | INTRAMUSCULAR | Status: DC
  Administered 2013-01-28 – 2013-01-29 (×3): 3 mL via INTRAVENOUS

## 2013-01-27 MED ORDER — DIBUCAINE 1 % RE OINT: 1.0000 "application " | TOPICAL_OINTMENT | RECTAL | Status: DC | PRN

## 2013-01-27 MED ORDER — CARBOPROST TROMETHAMINE 250 MCG/ML IM SOLN
INTRAMUSCULAR | Status: AC
  Filled 2013-01-27: qty 1

## 2013-01-27 MED ORDER — MEASLES, MUMPS & RUBELLA VAC ~~LOC~~ INJ
0.5000 mL | INJECTION | Freq: Once | SUBCUTANEOUS | Status: DC
  Filled 2013-01-27: qty 0.5

## 2013-01-27 MED ORDER — PHENYLEPHRINE 40 MCG/ML (10ML) SYRINGE FOR IV PUSH (FOR BLOOD PRESSURE SUPPORT)
80.0000 ug | PREFILLED_SYRINGE | INTRAVENOUS | Status: DC | PRN
  Filled 2013-01-27: qty 2

## 2013-01-27 MED ORDER — MISOPROSTOL 200 MCG PO TABS
ORAL_TABLET | ORAL | Status: AC
  Filled 2013-01-27: qty 5

## 2013-01-27 NOTE — H&P (Signed)
Attestation of Attending Supervision of Advanced Practitioner (CNM/NP): Evaluation and management procedures were performed by the Advanced Practitioner under my supervision and collaboration.  I have reviewed the Advanced Practitioner's note and chart, and I agree with the management and plan.  HARRAWAY-SMITH, Tasean Mancha 7:59 AM

## 2013-01-27 NOTE — Progress Notes (Signed)
Adrienne Crawford is a 22 y.o. G3P1011 at [redacted]w[redacted]d admitted for induction of labor due to [facility].  Subjective: Feeling uc's, more in back  Objective: BP 128/88  Pulse 71  Temp(Src) 97.7 F (36.5 C) (Oral)  Resp 20  Ht 5\' 6"  (1.676 m)  Wt 86.637 kg (191 lb)  BMI 30.84 kg/m2  LMP 04/19/2012 I/O last 3 completed shifts: In: 2883.9 [P.O.:720; I.V.:1713.9; IV Piggyback:450] Out: 3650 [Urine:3650] Total I/O In: 1158.9 [P.O.:360; I.V.:598.9; IV Piggyback:200] Out: 1050 [Urine:1050]  FHT:  FHR: 120 bpm, variability: moderate,  accelerations:  Present,  decelerations:  Absent UC:   regular, every 3-5 minutes SVE:   Dilation: 4 Effacement (%): 60 Station: -2 Exam by:: Adrienne Crawford  @ 0900 Deferred at this time- pt wants to get epidural before arom  Labs: Lab Results  Component Value Date   WBC 8.5 01/26/2013   HGB 10.3* 01/26/2013   HCT 29.7* 01/26/2013   MCV 92.0 01/26/2013   PLT 130* 01/26/2013    Assessment / Plan: IOL d/t GHTN, wants epidural prior to arom. Will arom after comfortable w/ epidural.  Pitocin currently at 25mu/min  Labor: not yet Preeclampsia:  on magnesium sulfate and no signs or symptoms of toxicity Fetal Wellbeing:  Category I Pain Control:  nubain, requesting epidural I/D:  pcn Anticipated MOD:  NSVD  Adrienne Crawford, Adrienne Crawford 01/27/2013, 12:58 PM

## 2013-01-27 NOTE — Progress Notes (Signed)
Adrienne Crawford is a 22 y.o. G3P1011 at [redacted]w[redacted]d admitted for induction of labor due to pre-eclampsia.  Subjective: Feeling pain in back  Objective: BP 121/73  Pulse 78  Temp(Src) 97.7 F (36.5 C) (Oral)  Resp 18  Ht 5\' 6"  (1.676 m)  Wt 86.637 kg (191 lb)  BMI 30.84 kg/m2  LMP 04/19/2012 I/O last 3 completed shifts: In: 2883.9 [P.O.:720; I.V.:1713.9; IV Piggyback:450] Out: 3650 [Urine:3650] Total I/O In: 470 [P.O.:120; I.V.:250; IV Piggyback:100] Out: 250 [Urine:250]  FHT:  FHR: 120 bpm, variability: moderate,  accelerations:  Present,  decelerations:  Absent UC:   Irregular, mild SVE:   Dilation: 4 Effacement (%): 60 Station: -2 Exam by:: Adrienne Crawford  @ 0900  Labs: Lab Results  Component Value Date   WBC 8.5 01/26/2013   HGB 10.3* 01/26/2013   HCT 29.7* 01/26/2013   MCV 92.0 01/26/2013   PLT 130* 01/26/2013    Assessment / Plan: IOL d/t Pre-e, pitocin @ 35mu/min, will continue to increase then arom  Labor: not yet Preeclampsia:  on magnesium sulfate and no signs or symptoms of toxicity Fetal Wellbeing:  Category I Pain Control:  nubain I/D:  pcn per protocol for gbs pos Anticipated MOD:  NSVD  Adrienne Crawford, Adrienne Crawford 01/27/2013, 10:02 AM

## 2013-01-27 NOTE — Progress Notes (Signed)
Adrienne Crawford is a 22 y.o. G3P1011 at [redacted]w[redacted]d admitted for induction of labor due to [facility].  Subjective: Comfortable w/ epidural.  Denies ha, scotomata, ruq/epigastric pain, n/v.    Objective: BP 133/77  Pulse 79  Temp(Src) 97.7 F (36.5 C) (Oral)  Resp 20  Ht 5\' 6"  (1.676 m)  Wt 86.637 kg (191 lb)  BMI 30.84 kg/m2  SpO2 99%  LMP 04/19/2012 I/O last 3 completed shifts: In: 2883.9 [P.O.:720; I.V.:1713.9; IV Piggyback:450] Out: 3650 [Urine:3650] Total I/O In: 2059.4 [P.O.:480; I.V.:1279.4; IV Piggyback:300] Out: 1400 [Urine:1400]  FHT:  FHR: 120 bpm, variability: minimal ,  accelerations:  Present,  decelerations:  Present variable x 1 UC:   regular, every 3-6 minutes SVE:   4/60/-2, arom w/ moderate amount clear fluid +scalp stim  Labs: Lab Results  Component Value Date   WBC 8.6 01/27/2013   HGB 10.3* 01/27/2013   HCT 30.0* 01/27/2013   MCV 92.9 01/27/2013   PLT 134* 01/27/2013    Assessment / Plan: IOL d/t GHTN, pitocin at 33mu/min, now arom'd- continue to increase pitocin per protocol to achieve adequate labor/dilation  Labor: not yet Preeclampsia:  on magnesium sulfate and no signs or symptoms of toxicity Fetal Wellbeing:  Category II Pain Control:  Epidural I/D:  pcn per protocol for gbs pos Anticipated MOD:  NSVD  Vineta, Carone 01/27/2013, 2:50 PM

## 2013-01-27 NOTE — Progress Notes (Signed)
Adrienne Crawford is a 22 y.o. G3P1011 at [redacted]w[redacted]d by ultrasound admitted for induction of labor due to Pre-Eclampsia.  Subjective: Having frequent back pain, but it is controlled with Nubain. Patient planning epidural.  Objective: BP 122/75  Pulse 68  Temp(Src) 97.6 F (36.4 C) (Oral)  Resp 16  Ht 5\' 6"  (1.676 m)  Wt 86.637 kg (191 lb)  BMI 30.84 kg/m2  LMP 04/19/2012   Total I/O In: 1470 [P.O.:440; I.V.:680; IV Piggyback:350] Out: 1950 [Urine:1950]  FHT:  FHR: 130 bpm, variability: minimal but responded to scalp stimulation,  accelerations:  Present,  decelerations:  Absent UC:   regular, every 1-5 minutes SVE:   Dilation: 3 Effacement (%): 50 Station: -3 Exam by:: Dr. Adriana Simas  Labs: Lab Results  Component Value Date   WBC 8.5 01/26/2013   HGB 10.3* 01/26/2013   HCT 29.7* 01/26/2013   MCV 92.0 01/26/2013   PLT 130* 01/26/2013    Assessment / Plan: IOL due to gest HTN  Labor: Progressing on Pitocin, will continue to increase then AROM Preeclampsia:  on magnesium sulfate Fetal Wellbeing:  Category II Pain Control: Planning epidural Anticipated MOD:  NSVD  Everlene Other 01/27/2013, 1:44 AM  I have seen and examined this patient and I agree with the above. ESMAY, AMSPACHER 3:42 AM 01/27/2013

## 2013-01-27 NOTE — Anesthesia Procedure Notes (Signed)
Epidural Patient location during procedure: OB Start time: 01/27/2013 2:20 PM End time: 01/27/2013 2:24 PM  Staffing Anesthesiologist: Sandrea Hughs Performed by: anesthesiologist   Preanesthetic Checklist Completed: patient identified, site marked, surgical consent, pre-op evaluation, timeout performed, IV checked, risks and benefits discussed and monitors and equipment checked  Epidural Patient position: sitting Prep: site prepped and draped and DuraPrep Patient monitoring: continuous pulse ox and blood pressure Approach: midline Injection technique: LOR air  Needle:  Needle type: Tuohy  Needle gauge: 17 G Needle length: 9 cm and 9 Needle insertion depth: 5 cm cm Catheter type: closed end flexible Catheter size: 19 Gauge Catheter at skin depth: 10 cm Test dose: negative and Other  Assessment Sensory level: T9 Events: blood not aspirated, injection not painful, no injection resistance, negative IV test and no paresthesia  Additional Notes Reason for block:procedure for pain

## 2013-01-27 NOTE — Anesthesia Preprocedure Evaluation (Signed)
Anesthesia Evaluation  Patient identified by MRN, date of birth, ID band Patient awake    Reviewed: Allergy & Precautions, H&P , NPO status , Patient's Chart, lab work & pertinent test results  Airway Mallampati: II TM Distance: >3 FB Neck ROM: full    Dental no notable dental hx.    Pulmonary    Pulmonary exam normal       Cardiovascular negative cardio ROS      Neuro/Psych negative neurological ROS  negative psych ROS   GI/Hepatic negative GI ROS, Neg liver ROS,   Endo/Other  negative endocrine ROS  Renal/GU negative Renal ROS  negative genitourinary   Musculoskeletal negative musculoskeletal ROS (+)   Abdominal Normal abdominal exam  (+)   Peds negative pediatric ROS (+)  Hematology negative hematology ROS (+)   Anesthesia Other Findings   Reproductive/Obstetrics (+) Pregnancy                           Anesthesia Physical Anesthesia Plan  ASA: II  Anesthesia Plan: Epidural   Post-op Pain Management:    Induction:   Airway Management Planned:   Additional Equipment:   Intra-op Plan:   Post-operative Plan:   Informed Consent: I have reviewed the patients History and Physical, chart, labs and discussed the procedure including the risks, benefits and alternatives for the proposed anesthesia with the patient or authorized representative who has indicated his/her understanding and acceptance.     Plan Discussed with:   Anesthesia Plan Comments:         Anesthesia Quick Evaluation  

## 2013-01-28 LAB — CBC
HCT: 26.8 % — ABNORMAL LOW (ref 36.0–46.0)
Hemoglobin: 9.1 g/dL — ABNORMAL LOW (ref 12.0–15.0)
Hemoglobin: 7.6 g/dL — ABNORMAL LOW (ref 12.0–15.0)
MCV: 93.4 fL (ref 78.0–100.0)
MCV: 91.2 fL (ref 78.0–100.0)
Platelets: 100 10*3/uL — ABNORMAL LOW (ref 150–400)
RBC: 2.38 MIL/uL — ABNORMAL LOW (ref 3.87–5.11)
WBC: 9.1 10*3/uL (ref 4.0–10.5)
WBC: 10.4 10*3/uL (ref 4.0–10.5)

## 2013-01-28 LAB — URINE CULTURE

## 2013-01-28 MED ORDER — LACTATED RINGERS IV SOLN
INTRAVENOUS | Status: DC
  Administered 2013-01-28 (×2): via INTRAVENOUS

## 2013-01-28 MED ORDER — HYDROCORTISONE 1 % EX CREA
TOPICAL_CREAM | Freq: Two times a day (BID) | CUTANEOUS | Status: DC
  Administered 2013-01-28 – 2013-01-29 (×3): via TOPICAL
  Filled 2013-01-28: qty 28

## 2013-01-28 NOTE — Progress Notes (Signed)
CSW is aware of consult.  Noted neg UDS for MOB, awaiting UDS results from infant and will complete full consult.    [phone] 

## 2013-01-28 NOTE — Anesthesia Postprocedure Evaluation (Signed)
Anesthesia Post Note  Patient: Adrienne Crawford  Procedure(s) Performed: * No procedures listed *  Anesthesia type: Epidural  Patient location: AICU   Post pain: Pain level controlled  Post assessment: Post-op Vital signs reviewed  Last Vitals:  Filed Vitals:   01/28/13 0800  BP: 110/93  Pulse: 74  Temp: 36.6 C  Resp: 18    Post vital signs: Reviewed  Level of consciousness:alert  Complications: No apparent anesthesia complications

## 2013-01-28 NOTE — Progress Notes (Signed)
Post Partum Day 1 Subjective: feels weak, not dizzy  Objective: Blood pressure 132/77, pulse 77, temperature 98.5 F (36.9 C), temperature source Oral, resp. rate 18, height 5\' 6"  (1.676 m), weight 191 lb (86.637 kg), last menstrual period 04/19/2012, SpO2 99.00%, unknown if currently breastfeeding.  Physical Exam:  General: alert, cooperative and no distress Lochia: appropriate Uterine Fundus: firm DVT Evaluation: No evidence of DVT seen on physical exam.   Recent Labs  01/27/13 2020 01/28/13 0539  HGB 9.1* 7.6*  HCT 26.8* 21.7*    Assessment/Plan: Contraception depo Bottle feed  Magnesium sulfate for 24 hr postpartum Assess for s/sx anemia   LOS: 2 days   ARNOLD,JAMES 01/28/2013, 7:56 AM

## 2013-01-28 NOTE — Progress Notes (Signed)
I called Lab to let them know that the CBC results were not showing in computer.  After checking and calling me back lab reports Hemoglobin at 9.1 and hematocrit at 26.8.

## 2013-01-28 NOTE — Progress Notes (Signed)
CSW reviewed MOB's chart due to receiving consult for hx of SA.  MOB currently in ICU and on mag.  CSW will ask weekday CSW to follow-up with MOB tomorrow when more stable.    [phone] 

## 2013-01-29 LAB — CBC
HCT: 19.8 % — ABNORMAL LOW (ref 36.0–46.0)
HCT: 31.4 % — ABNORMAL LOW (ref 36.0–46.0)
Hemoglobin: 9 g/dL — ABNORMAL LOW (ref 12.0–15.0)
MCH: 31.9 pg (ref 26.0–34.0)
MCH: 30.9 pg (ref 26.0–34.0)
MCV: 94.3 fL (ref 78.0–100.0)
MCV: 107.9 fL — ABNORMAL HIGH (ref 78.0–100.0)
RBC: 2.1 MIL/uL — ABNORMAL LOW (ref 3.87–5.11)
RBC: 2.91 MIL/uL — ABNORMAL LOW (ref 3.87–5.11)
WBC: 6.9 10*3/uL (ref 4.0–10.5)

## 2013-01-29 MED ORDER — ALBUTEROL SULFATE HFA 108 (90 BASE) MCG/ACT IN AERS
2.0000 | INHALATION_SPRAY | RESPIRATORY_TRACT | Status: DC | PRN
  Administered 2013-01-30: 02:00:00 via RESPIRATORY_TRACT
  Filled 2013-01-29: qty 6.7

## 2013-01-29 NOTE — Progress Notes (Signed)
Post Partum Day 2 - IOL for Preeclampsia, PPH.  Subjective: Doing well this morning with just some mild back back and abdominal cramping. Lochia appropriate, Up ad lib, passing flatus.  Objective: Blood pressure 112/54, pulse 74, temperature 97.9 F (36.6 C), temperature source Oral, resp. rate 16, height 5\' 6"  (1.676 m), weight 86.637 kg (191 lb), last menstrual period 04/19/2012, SpO2 99.00%, unknown if currently breastfeeding.  Intake/Output Summary (Last 24 hours) at 01/29/13 0726 Last data filed at 01/29/13 0500  Gross per 24 hour  Intake 3252.91 ml  Output   3475 ml  Net -222.09 ml   Physical Exam:  General: alert, cooperative and no distress Lochia: appropriate Uterine Fundus: firm DVT Evaluation: No evidence of DVT seen on physical exam. No cords or calf tenderness. No significant calf/ankle edema.   Recent Labs  01/27/13 2020 01/28/13 0539  HGB 9.1* 7.6*  HCT 26.8* 21.7*    Assessment/Plan: Postpartum day 2 - Doing well.    Off Magnesium and blood pressures are stable (112/54 - 143/73); BP 112/54 this am. Will repeat CBC this am to ensure stability of Hb (7.6 on 6/8). Will transfer to postpartum. Contraception - Depo; Bottle feeding.   LOS: 3 days   Everlene Other 01/29/2013, 7:17 AM

## 2013-01-29 NOTE — Clinical Social Work Maternal (Signed)
    Clinical Social Work Department PSYCHOSOCIAL ASSESSMENT - MATERNAL/CHILD 01/29/2013  Patient:  Adrienne Crawford, Adrienne Crawford  Account Number:  [id number]  Admit Date:  01/26/2013  Marjo Bicker Name:   Delia Heady    Clinical Social Worker:  Nobie Putnam, LCSW   Date/Time:  01/29/2013 10:57 AM  Date Referred:  01/29/2013   Referral source  CN     Referred reason  Depression/Anxiety  Substance Abuse   Other referral source:    I:  FAMILY / HOME ENVIRONMENT Child's legal guardian:  PARENT  Guardian - Name Guardian - Age Guardian - Address  Adrienne Crawford 21 [address]; [place], [place] [address]  Adrienne Crawford 23    Other household support members/support persons Name Relationship DOB  Charna Busman STEPMOTHER    Other support:   FOB's family    II  PSYCHOSOCIAL DATA Information Source:  Patient Interview  Event organiser Employment:   Surveyor, quantity resources:  [employer] If Medicaid - County:  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   School / Grade:   Maternity Care Coordinator / Child Services Coordination / Early Interventions:  Cultural issues impacting care:    III  STRENGTHS Strengths  Adequate Resources  Home prepared for Child (including basic supplies)  Supportive family/friends   Strength comment:    IV  RISK FACTORS AND CURRENT PROBLEMS Current Problem:  YES   Risk Factor & Current Problem Patient Issue Family Issue Risk Factor / Current Problem Comment  Substance Abuse Y N Hx of MJ use  Mental Illness Y N Hx of depression/anxiety    V  SOCIAL WORK ASSESSMENT CSW referral received to assess pt's history of depression/anxiety.  Pt told CSW that she has struggled with depression since she was 22 years old.  The pt's mother was sick & passed away when pt was 22 years old. She has accepted mental health treatment "off & on" since then.  Prior to pregnancy, she symptoms were being managed with Lexapro & therapy.  She stopped taking the medication but  continued to attend weekly therapy session at [facility], during pregnancy.  Pt plans to continue therapy sessions upon discharge & restart medication, as she told CSW that she did not cope well without medicine.  She reports feeling fine now.  Pt's support system is limited to the FOB's family.  Pt has a 13 years old son of whom she does not have custody. She explained that she was tricked into to signing over her rights to her cousin (June '13), before she was incarcerated for 3 weeks.  She was charged with possession of drug paraphrenia.  Pt admits to smoking MJ prior to pregnancy but denies any use during pregnancy. Drug screen was not ordered.  Pt has a history with CPS but told CSW that her case was closed.  CSW called CPS to discuss pt's history but had to leave a message.  She has all the necessary supplies for the infant.  CSW available to assist further if necessary.      VI SOCIAL WORK PLAN Social Work Plan  No Further Intervention Required / No Barriers to Discharge   Type of pt/family education:   If child protective services report - county:   If child protective services report - date:   Information/referral to community resources comment:   Other social work plan:

## 2013-01-29 NOTE — Progress Notes (Signed)
UR chart review completed.  

## 2013-01-30 LAB — TYPE AND SCREEN
ABO/RH(D): A POS
Unit division: 0
Unit division: 0
Unit division: 0

## 2013-01-30 MED ORDER — FERROUS SULFATE 325 (65 FE) MG PO TABS: 325.0000 mg | ORAL_TABLET | Freq: Three times a day (TID) | ORAL | Status: DC

## 2013-01-30 MED ORDER — IBUPROFEN 600 MG PO TABS: 600.0000 mg | ORAL_TABLET | Freq: Four times a day (QID) | ORAL | Status: DC

## 2013-01-30 NOTE — Progress Notes (Signed)
Attestation of Attending Supervision of Resident: Evaluation and management procedures were performed by the [facility] Medicine Resident under my supervision.  I have seen and examined the patient, reviewed the resident's note and chart, and I agree with the management and plan.  Anibal Henderson, M.D. 01/30/2013 8:38 AM

## 2013-01-30 NOTE — Discharge Summary (Signed)
Obstetric Discharge Summary Reason for Admission: induction of labor Prenatal Procedures: ultrasound Intrapartum Procedures: spontaneous vaginal delivery Postpartum Procedures: transfusion 2 units PRBC Complications-Operative and Postpartum: hemorrhage - Treated with Methergine. Hemoglobin  Date Value Range Status  01/29/2013 9.0* 12.0 - 15.0 g/dL Final     REPEATED TO VERIFY     DELTA CHECK NOTED     POST TRANSFUSION SPECIMEN     HCT  Date Value Range Status  01/29/2013 31.4* 36.0 - 46.0 % Final    Physical Exam:  General: alert, cooperative and no distress Lochia: appropriate Uterine Fundus: firm DVT Evaluation: No evidence of DVT seen on physical exam. No cords or calf tenderness. No significant calf/ankle edema.  Discharge Diagnoses: Term Pregnancy-delivered  Hospital Course: TAMAIRA CIRIELLO is a 22 y.o. [id number] at [redacted]w[redacted]d who was admitted to the hospital for IOL due to Pre-Eclampsia.  Patient had a SVD.  Complications: PPH requiring blood transfusion (2 units PRBC's).  Pt is bottle feeding and plans to use Depo-Provera for contraception. She will follow up with [facility] in 4 weeks.   Discharge Information: Date: 01/30/2013 Activity: pelvic rest Diet: routine Medications: PNV, Ibuprofen and Iron Condition: stable Instructions: refer to practice specific booklet Discharge to: home   Newborn Data: Live born female  Birth Weight: 7 lb 2.3 oz (3240 g) APGAR: 7, 9  Home with mother.  Everlene Other 01/30/2013, 7:11 AM  I saw and examined patient and agree with above resident note. I reviewed history, delivery summary, labs and vitals. Napoleon Form, MD

## 2013-02-02 NOTE — Discharge Summary (Signed)
Attestation of Attending Supervision of Obstetric Fellow: Evaluation and management procedures were performed by the Obstetric Fellow under my supervision and collaboration.  I have reviewed the Obstetric Fellow's note and chart, and I agree with the management and plan.  UGONNA  ANYANWU, MD, FACOG Attending Obstetrician & Gynecologist Faculty Practice, [facility]   

## 2013-02-06 ENCOUNTER — Telehealth: Payer: Self-pay | Admitting: *Deleted

## 2013-02-06 ENCOUNTER — Ambulatory Visit: Payer: Medicaid Other | Admitting: Adult Health

## 2013-02-06 NOTE — Telephone Encounter (Signed)
Adrienne Crawford, from the [facility] Department states patient Hgb 9, c/o dizziness, headaches, shortness of breath on exertion. Pt also stating "feels like she is going to pass out"  B/P 120/84, Pulse 80-84. Appt made today at 1:30pm.

## 2013-02-07 ENCOUNTER — Telehealth: Payer: Self-pay | Admitting: *Deleted

## 2013-02-07 ENCOUNTER — Encounter: Payer: Self-pay | Admitting: Adult Health

## 2013-02-07 ENCOUNTER — Ambulatory Visit (INDEPENDENT_AMBULATORY_CARE_PROVIDER_SITE_OTHER): Payer: Medicaid Other | Admitting: Adult Health

## 2013-02-07 VITALS — BP 144/94 | Ht 66.0 in | Wt 163.0 lb

## 2013-02-07 DIAGNOSIS — D649 Anemia, unspecified: Secondary | ICD-10-CM

## 2013-02-07 DIAGNOSIS — F32A Depression, unspecified: Secondary | ICD-10-CM

## 2013-02-07 DIAGNOSIS — F329 Major depressive disorder, single episode, unspecified: Secondary | ICD-10-CM

## 2013-02-07 DIAGNOSIS — O99019 Anemia complicating pregnancy, unspecified trimester: Secondary | ICD-10-CM

## 2013-02-07 DIAGNOSIS — O99340 Other mental disorders complicating pregnancy, unspecified trimester: Secondary | ICD-10-CM

## 2013-02-07 DIAGNOSIS — O169 Unspecified maternal hypertension, unspecified trimester: Secondary | ICD-10-CM

## 2013-02-07 DIAGNOSIS — O09299 Supervision of pregnancy with other poor reproductive or obstetric history, unspecified trimester: Secondary | ICD-10-CM

## 2013-02-07 HISTORY — DX: Anemia, unspecified: D64.9

## 2013-02-07 LAB — COMPREHENSIVE METABOLIC PANEL
AST: 12 U/L (ref 0–37)
Albumin: 3.7 g/dL (ref 3.5–5.2)
Alkaline Phosphatase: 81 U/L (ref 39–117)
BUN: 14 mg/dL (ref 6–23)
Creat: 0.76 mg/dL (ref 0.50–1.10)
Potassium: 4.6 mEq/L (ref 3.5–5.3)

## 2013-02-07 LAB — CBC
HCT: 33.2 % — ABNORMAL LOW (ref 36.0–46.0)
MCHC: 34.6 g/dL (ref 30.0–36.0)
RDW: 15.1 % (ref 11.5–15.5)

## 2013-02-07 LAB — POCT HEMOGLOBIN: Hemoglobin: 11.7 g/dL — AB (ref 12.2–16.2)

## 2013-02-07 MED ORDER — SERTRALINE HCL 50 MG PO TABS: 50.0000 mg | ORAL_TABLET | Freq: Every day | ORAL | Status: DC

## 2013-02-07 MED ORDER — FUSION PLUS PO CAPS: 1.0000 | ORAL_CAPSULE | Freq: Every day | ORAL | Status: DC

## 2013-02-07 NOTE — Telephone Encounter (Signed)
Adrienne Crawford, [facility] Department, states pt scored 17 on [place] Depression scale, pt c/o anxiety and panic attacks does have an appt with [facility] to follow up with medications. Pt has an appt today with Cyril Mourning, NP

## 2013-02-07 NOTE — Patient Instructions (Addendum)
Eat frequently  Increase fluids Start zoloft and iron pills Follow up in 2days Keep appt with [facility]

## 2013-02-07 NOTE — Progress Notes (Signed)
Subjective:     Patient ID: Adrienne Crawford, female   DOB: [birth year], 22 y.o.   MRN: [id number]  HPI Adrienne Crawford is 22 year old white female in complaining of dizziness and shortness of breath with exertion  and headaches and feels like she could pass out.Her back aches and she has vaginal odor.She has history of depression and is now.She denies suicidal ideation or wanting to hurt her baby. She had a vaginal delivery 01/27/13 after being induced for PIH, she hemorrhaged and got blood and spent time in ICU after delivery. No sex yet and she is not breast feeding.She is thinking about getting Depo.[facility] about her yesterday and got this appt.Yesterday her BP was 120/84.  Review of Systems Positives as in HPI Reviewed past medical,surgical, social and family history. Reviewed medications and allergies.     Objective:   Physical Exam BP 144/94  Ht 5\' 6"  (1.676 m)  Wt 163 lb (73.936 kg)  BMI 26.32 kg/m2  LMP 04/19/2012  Breastfeeding? NoFinger stick hgb 11.7 BP re check was 120/80 and pulse was 80. Skin warm and dry. Neck: mid line trachea, normal thyroid. Lungs: clear to ausculation bilaterally. Cardiovascular: regular rate and rhythm. Pelvic: external genitalia is normal in appearance, vagina: period type blood, cervix:smooth and bulbous, uterus: 7 week  size, non tender, no masses felt, adnexa: no masses or tenderness noted. No RUQ pain, DTRs 1= no clonus Edinburgh post natal depression score 16 today was 23 yesterday but she has had depression and sees [facility] and has appt today.    Assessment:     Depression Anemia Fatigue History PIH    Plan:     Rx zoloft 50 mg 1 daily with 6 refills Rx fusion plus 1 daily Eat frequently and increase fluids Return in 2 days in follow up Keep appt with [facility]   Call prn

## 2013-02-09 ENCOUNTER — Ambulatory Visit: Payer: Medicaid Other | Admitting: Adult Health

## 2013-02-13 ENCOUNTER — Ambulatory Visit: Payer: Medicaid Other | Admitting: Adult Health

## 2013-02-22 ENCOUNTER — Encounter: Payer: Medicaid Other | Admitting: Obstetrics & Gynecology

## 2013-02-22 ENCOUNTER — Ambulatory Visit: Payer: Medicaid Other | Admitting: Obstetrics & Gynecology

## 2013-02-27 ENCOUNTER — Ambulatory Visit: Payer: Medicaid Other | Admitting: Obstetrics & Gynecology

## 2013-03-09 ENCOUNTER — Ambulatory Visit: Payer: Medicaid Other | Admitting: Obstetrics & Gynecology

## 2013-03-12 ENCOUNTER — Encounter: Payer: Self-pay | Admitting: Obstetrics & Gynecology

## 2013-03-12 ENCOUNTER — Ambulatory Visit (INDEPENDENT_AMBULATORY_CARE_PROVIDER_SITE_OTHER): Payer: Medicaid Other | Admitting: Obstetrics & Gynecology

## 2013-03-12 MED ORDER — MECLIZINE HCL 50 MG PO TABS: 50.0000 mg | ORAL_TABLET | Freq: Three times a day (TID) | ORAL | Status: DC | PRN

## 2013-03-12 MED ORDER — PREDNISONE (PAK) 10 MG PO TABS: 10.0000 mg | ORAL_TABLET | Freq: Every day | ORAL | Status: DC

## 2013-03-12 NOTE — Patient Instructions (Signed)
Vertigo Vertigo means you feel like you or your surroundings are moving when they are not. Vertigo can be dangerous if it occurs when you are at work, driving, or performing difficult activities.  CAUSES  Vertigo occurs when there is a conflict of signals sent to your brain from the visual and sensory systems in your body. There are many different causes of vertigo, including:  Infections, especially in the inner ear.  A bad reaction to a drug or misuse of alcohol and medicines.  Withdrawal from drugs or alcohol.  Rapidly changing positions, such as lying down or rolling over in bed.  A migraine headache.  Decreased blood flow to the brain.  Increased pressure in the brain from a head injury, infection, tumor, or bleeding. SYMPTOMS  You may feel as though the world is spinning around or you are falling to the ground. Because your balance is upset, vertigo can cause nausea and vomiting. You may have involuntary eye movements (nystagmus). DIAGNOSIS  Vertigo is usually diagnosed by physical exam. If the cause of your vertigo is unknown, your caregiver may perform imaging tests, such as an MRI scan (magnetic resonance imaging). TREATMENT  Most cases of vertigo resolve on their own, without treatment. Depending on the cause, your caregiver may prescribe certain medicines. If your vertigo is related to body position issues, your caregiver may recommend movements or procedures to correct the problem. In rare cases, if your vertigo is caused by certain inner ear problems, you may need surgery. HOME CARE INSTRUCTIONS   Follow your caregiver's instructions.  Avoid driving.  Avoid operating heavy machinery.  Avoid performing any tasks that would be dangerous to you or others during a vertigo episode.  Tell your caregiver if you notice that certain medicines seem to be causing your vertigo. Some of the medicines used to treat vertigo episodes can actually make them worse in some people. SEEK  IMMEDIATE MEDICAL CARE IF:   Your medicines do not relieve your vertigo or are making it worse.  You develop problems with talking, walking, weakness, or using your arms, hands, or legs.  You develop severe headaches.  Your nausea or vomiting continues or gets worse.  You develop visual changes.  A family member notices behavioral changes.  Your condition gets worse. MAKE SURE YOU:  Understand these instructions.  Will watch your condition.  Will get help right away if you are not doing well or get worse. Document Released: 05/19/2005 Document Revised: 11/01/2011 Document Reviewed: 02/25/2011 Exit[employer] Patient Information 2014 [employer].  

## 2013-03-12 NOTE — Progress Notes (Signed)
Patient ID: Adrienne Crawford, female   DOB: 1991-07-07, 22 y.o.   MRN: [id number] On 01/27/2013 At 7:21 PM a viable female was delivered via Vaginal, Spontaneous Delivery (Presentation: Right Occiput Anterior) w/ loose nuchal cord x 1, delivered through. Shoulders were not forthcoming, so pt was placed in mcrobert's position, and then suprapubic pressure performed by RN w/ resulting delivery of anterior (left) shoulder and then remainder of body. Infant had respirations and cry w/ drying/stimulation. APGAR: not available at time of note; weight: not available at time of note.  Placenta status: delivered spontaneously intact, large amount of blood ~400cc in amniotic sac . Cord: 3vc with the following complications: none. Cord pH: not done  Oozing noted of additional ~200cc- fundus firmed up w/ massage, cytotec 1000mg  pr given w/ resolution of oozing.    Pt complaining of dizziness with standing and rotational head movement, has to sit down or lean against a wall, does get nauseated but does not throw up, has also vision changes at the same time.  On provocative exam is very dizzy with rapid movement  Will try to use antivert and prednisone x 10 days and follow up in 2 weeks also has chronic paraspinous muscle pain and spasm, recommend seeing a chiropracter  Pelvic exam normal normal involution considring depo for [facility]

## 2013-03-28 ENCOUNTER — Encounter: Payer: Self-pay | Admitting: Obstetrics & Gynecology

## 2013-03-28 ENCOUNTER — Ambulatory Visit (INDEPENDENT_AMBULATORY_CARE_PROVIDER_SITE_OTHER): Payer: Medicaid Other | Admitting: Obstetrics & Gynecology

## 2013-03-28 DIAGNOSIS — R42 Dizziness and giddiness: Secondary | ICD-10-CM

## 2013-03-28 MED ORDER — SERTRALINE HCL 50 MG PO TABS: 50.0000 mg | ORAL_TABLET | Freq: Every day | ORAL | Status: DC

## 2013-03-28 NOTE — Progress Notes (Signed)
Patient ID: Adrienne Crawford, female   DOB: 1990-09-21, 22 y.o.   MRN: [id number] Did not take the prednisone, took the antivert Needs another rx for the zoloft, done  Encouraged to take the prednisone and if dizziness no better then see PCP

## 2013-09-03 ENCOUNTER — Emergency Department (HOSPITAL_COMMUNITY)
Admission: EM | Admit: 2013-09-03 | Discharge: 2013-09-04 | Disposition: A | Discharge status: Home / Self Care | Payer: Medicaid Other | Attending: Emergency Medicine | Admitting: Emergency Medicine

## 2013-09-03 ENCOUNTER — Encounter (HOSPITAL_COMMUNITY): Payer: Self-pay | Admitting: Emergency Medicine

## 2013-09-03 DIAGNOSIS — B9689 Other specified bacterial agents as the cause of diseases classified elsewhere: Secondary | ICD-10-CM | POA: Insufficient documentation

## 2013-09-03 DIAGNOSIS — R21 Rash and other nonspecific skin eruption: Secondary | ICD-10-CM | POA: Insufficient documentation

## 2013-09-03 DIAGNOSIS — F3289 Other specified depressive episodes: Secondary | ICD-10-CM | POA: Insufficient documentation

## 2013-09-03 DIAGNOSIS — Y93G9 Activity, other involving cooking and grilling: Secondary | ICD-10-CM | POA: Insufficient documentation

## 2013-09-03 DIAGNOSIS — Z791 Long term (current) use of non-steroidal anti-inflammatories (NSAID): Secondary | ICD-10-CM | POA: Insufficient documentation

## 2013-09-03 DIAGNOSIS — IMO0002 Reserved for concepts with insufficient information to code with codable children: Secondary | ICD-10-CM | POA: Insufficient documentation

## 2013-09-03 DIAGNOSIS — F329 Major depressive disorder, single episode, unspecified: Secondary | ICD-10-CM | POA: Insufficient documentation

## 2013-09-03 DIAGNOSIS — X131XXA Other contact with steam and other hot vapors, initial encounter: Secondary | ICD-10-CM

## 2013-09-03 DIAGNOSIS — T23269A Burn of second degree of back of unspecified hand, initial encounter: Secondary | ICD-10-CM | POA: Insufficient documentation

## 2013-09-03 DIAGNOSIS — Y929 Unspecified place or not applicable: Secondary | ICD-10-CM | POA: Insufficient documentation

## 2013-09-03 DIAGNOSIS — R109 Unspecified abdominal pain: Secondary | ICD-10-CM | POA: Insufficient documentation

## 2013-09-03 DIAGNOSIS — Z79899 Other long term (current) drug therapy: Secondary | ICD-10-CM | POA: Insufficient documentation

## 2013-09-03 DIAGNOSIS — T23001A Burn of unspecified degree of right hand, unspecified site, initial encounter: Secondary | ICD-10-CM

## 2013-09-03 DIAGNOSIS — X12XXXA Contact with other hot fluids, initial encounter: Secondary | ICD-10-CM | POA: Insufficient documentation

## 2013-09-03 DIAGNOSIS — T23031A Burn of unspecified degree of multiple right fingers (nail), not including thumb, initial encounter: Secondary | ICD-10-CM

## 2013-09-03 DIAGNOSIS — J45909 Unspecified asthma, uncomplicated: Secondary | ICD-10-CM | POA: Insufficient documentation

## 2013-09-03 DIAGNOSIS — Z8742 Personal history of other diseases of the female genital tract: Secondary | ICD-10-CM | POA: Insufficient documentation

## 2013-09-03 DIAGNOSIS — A499 Bacterial infection, unspecified: Secondary | ICD-10-CM | POA: Insufficient documentation

## 2013-09-03 DIAGNOSIS — Z3202 Encounter for pregnancy test, result negative: Secondary | ICD-10-CM | POA: Insufficient documentation

## 2013-09-03 DIAGNOSIS — F410 Panic disorder [episodic paroxysmal anxiety] without agoraphobia: Secondary | ICD-10-CM | POA: Insufficient documentation

## 2013-09-03 DIAGNOSIS — Z792 Long term (current) use of antibiotics: Secondary | ICD-10-CM | POA: Insufficient documentation

## 2013-09-03 DIAGNOSIS — Z87440 Personal history of urinary (tract) infections: Secondary | ICD-10-CM | POA: Insufficient documentation

## 2013-09-03 DIAGNOSIS — D649 Anemia, unspecified: Secondary | ICD-10-CM | POA: Insufficient documentation

## 2013-09-03 DIAGNOSIS — F17200 Nicotine dependence, unspecified, uncomplicated: Secondary | ICD-10-CM | POA: Insufficient documentation

## 2013-09-03 DIAGNOSIS — T23239A Burn of second degree of unspecified multiple fingers (nail), not including thumb, initial encounter: Secondary | ICD-10-CM | POA: Insufficient documentation

## 2013-09-03 DIAGNOSIS — N760 Acute vaginitis: Secondary | ICD-10-CM | POA: Insufficient documentation
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
## 2013-09-04 ENCOUNTER — Ambulatory Visit (HOSPITAL_COMMUNITY): Admit: 2013-09-04 | Payer: Medicaid Other

## 2013-09-04 ENCOUNTER — Other Ambulatory Visit (HOSPITAL_COMMUNITY): Payer: Self-pay | Admitting: Emergency Medicine

## 2013-09-04 DIAGNOSIS — R102 Pelvic and perineal pain: Secondary | ICD-10-CM

## 2013-09-04 LAB — URINALYSIS, ROUTINE W REFLEX MICROSCOPIC
Bilirubin Urine: NEGATIVE
GLUCOSE, UA: NEGATIVE mg/dL
Hgb urine dipstick: NEGATIVE
LEUKOCYTES UA: NEGATIVE
NITRITE: NEGATIVE
PROTEIN: NEGATIVE mg/dL
Specific Gravity, Urine: >1.03 — ABNORMAL HIGH (ref 1.005–1.030)
UROBILINOGEN UA: 0.2 mg/dL (ref 0.0–1.0)
pH: 6 (ref 5.0–8.0)

## 2013-09-04 LAB — CBC WITH DIFFERENTIAL/PLATELET
BASOS ABS: 0 10*3/uL (ref 0.0–0.1)
BASOS PCT: 0 % (ref 0–1)
EOS ABS: 0.1 10*3/uL (ref 0.0–0.7)
Eosinophils Relative: 2 % (ref 0–5)
HCT: 36.9 % (ref 36.0–46.0)
Hemoglobin: 13 g/dL (ref 12.0–15.0)
Lymphocytes Relative: 49 % — ABNORMAL HIGH (ref 12–46)
Lymphs Abs: 3.4 10*3/uL (ref 0.7–4.0)
MCH: 31.9 pg (ref 26.0–34.0)
MCHC: 35.2 g/dL (ref 30.0–36.0)
MCV: 90.4 fL (ref 78.0–100.0)
MONOS PCT: 8 % (ref 3–12)
Monocytes Absolute: 0.6 10*3/uL (ref 0.1–1.0)
NEUTROS ABS: 2.7 10*3/uL (ref 1.7–7.7)
NEUTROS PCT: 41 % — AB (ref 43–77)
PLATELETS: 168 10*3/uL (ref 150–400)
RBC: 4.08 MIL/uL (ref 3.87–5.11)
RDW: 15.4 % (ref 11.5–15.5)
WBC: 6.8 10*3/uL (ref 4.0–10.5)

## 2013-09-04 LAB — WET PREP, GENITAL
TRICH WET PREP: NONE SEEN
Yeast Wet Prep HPF POC: NONE SEEN

## 2013-09-04 LAB — RPR: RPR: NONREACTIVE

## 2013-09-04 LAB — PREGNANCY, URINE: PREG TEST UR: NEGATIVE

## 2013-09-04 LAB — HIV ANTIBODY (ROUTINE TESTING W REFLEX): HIV: NONREACTIVE

## 2013-09-04 MED ORDER — NAPROXEN 500 MG PO TABS: 500.0000 mg | ORAL_TABLET | Freq: Two times a day (BID) | ORAL | Status: DC

## 2013-09-04 MED ORDER — OXYCODONE-ACETAMINOPHEN 5-325 MG PO TABS: 1.0000 | ORAL_TABLET | ORAL | Status: DC | PRN

## 2013-09-04 MED ORDER — BACITRACIN ZINC 500 UNIT/GM EX OINT: 1.0000 "application " | TOPICAL_OINTMENT | Freq: Two times a day (BID) | CUTANEOUS | Status: DC

## 2013-09-04 MED ORDER — OXYCODONE-ACETAMINOPHEN 5-325 MG PO TABS
2.0000 | ORAL_TABLET | Freq: Once | ORAL | Status: AC
  Administered 2013-09-04: 2 via ORAL
  Filled 2013-09-04: qty 2

## 2013-09-04 MED ORDER — METRONIDAZOLE 500 MG PO TABS: 500.0000 mg | ORAL_TABLET | Freq: Two times a day (BID) | ORAL | Status: DC
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
## 2013-09-04 NOTE — Discharge Instructions (Signed)
Your testing shows that you have BV - a vaginal infection - flagyl for 7 days, come back in the morning for an ultrasound of your pelvis.  See list below for follow up if you don't have a family doctor.  [facility]    Kari BaarsEdward Hawkins MD. Specialty: Pulmonary Disease Contact information: [address]  [address]  [place] [address]  [phone]   Syliva OvermanMargaret Simpson, MD. Specialty: [facility] Contact information: [address]  [place] [address]  [phone]   Lilyan PuntScott Luking, MD. Specialty: [facility] Contact information: [address]  [address]  [place] [address]  [phone]   Avon Gullyesfaye Fanta, MD Specialty: Internal Medicine Contact information: [address]  [place] [address]  [phone]   Catalina PizzaZach Hall, MD. Specialty: Internal Medicine Contact information: [address]  [place] [address]  [phone]   Butch PennyAngus Mcinnis, MD. Specialty: Family Medicine Contact information: [address]  [place] [address]  [phone]   John GiovanniStephen Knowlton, MD. Specialty: [facility] Contact information: [address]  [address]  [place] [address]  [phone]   Carylon Perchesoy Fagan, MD. Specialty: Internal Medicine Contact information: [address]  [address]  [place] [address]  [phone]

## 2013-09-05 LAB — GC/CHLAMYDIA PROBE AMP
CT PROBE, AMP APTIMA: NEGATIVE
GC PROBE AMP APTIMA: NEGATIVE

## 2014-04-29 ENCOUNTER — Encounter (HOSPITAL_COMMUNITY): Payer: Self-pay | Admitting: Emergency Medicine

## 2014-04-29 ENCOUNTER — Emergency Department (HOSPITAL_COMMUNITY): Payer: Medicaid Other

## 2014-04-29 ENCOUNTER — Emergency Department (HOSPITAL_COMMUNITY)
Admission: EM | Admit: 2014-04-29 | Discharge: 2014-04-29 | Disposition: A | Discharge status: Home / Self Care | Payer: Medicaid Other | Attending: Emergency Medicine | Admitting: Emergency Medicine

## 2014-04-29 DIAGNOSIS — Z862 Personal history of diseases of the blood and blood-forming organs and certain disorders involving the immune mechanism: Secondary | ICD-10-CM | POA: Diagnosis not present

## 2014-04-29 DIAGNOSIS — Z8659 Personal history of other mental and behavioral disorders: Secondary | ICD-10-CM | POA: Diagnosis not present

## 2014-04-29 DIAGNOSIS — N83202 Unspecified ovarian cyst, left side: Secondary | ICD-10-CM

## 2014-04-29 DIAGNOSIS — R42 Dizziness and giddiness: Secondary | ICD-10-CM | POA: Insufficient documentation

## 2014-04-29 DIAGNOSIS — N83209 Unspecified ovarian cyst, unspecified side: Secondary | ICD-10-CM | POA: Diagnosis not present

## 2014-04-29 DIAGNOSIS — J45909 Unspecified asthma, uncomplicated: Secondary | ICD-10-CM | POA: Insufficient documentation

## 2014-04-29 DIAGNOSIS — A599 Trichomoniasis, unspecified: Secondary | ICD-10-CM | POA: Diagnosis not present

## 2014-04-29 DIAGNOSIS — Z3202 Encounter for pregnancy test, result negative: Secondary | ICD-10-CM | POA: Diagnosis not present

## 2014-04-29 DIAGNOSIS — Z87440 Personal history of urinary (tract) infections: Secondary | ICD-10-CM | POA: Diagnosis not present

## 2014-04-29 DIAGNOSIS — M549 Dorsalgia, unspecified: Secondary | ICD-10-CM | POA: Diagnosis not present

## 2014-04-29 DIAGNOSIS — N898 Other specified noninflammatory disorders of vagina: Secondary | ICD-10-CM | POA: Diagnosis present

## 2014-04-29 DIAGNOSIS — F17200 Nicotine dependence, unspecified, uncomplicated: Secondary | ICD-10-CM | POA: Insufficient documentation

## 2014-04-29 LAB — CBC WITH DIFFERENTIAL/PLATELET
BASOS ABS: 0 10*3/uL (ref 0.0–0.1)
Basophils Relative: 0 % (ref 0–1)
Eosinophils Absolute: 0 10*3/uL (ref 0.0–0.7)
Eosinophils Relative: 1 % (ref 0–5)
HEMATOCRIT: 41.2 % (ref 36.0–46.0)
Hemoglobin: 14.4 g/dL (ref 12.0–15.0)
LYMPHS ABS: 1.9 10*3/uL (ref 0.7–4.0)
LYMPHS PCT: 27 % (ref 12–46)
MCH: 33 pg (ref 26.0–34.0)
MCHC: 35 g/dL (ref 30.0–36.0)
MCV: 94.5 fL (ref 78.0–100.0)
MONO ABS: 0.9 10*3/uL (ref 0.1–1.0)
Monocytes Relative: 13 % — ABNORMAL HIGH (ref 3–12)
NEUTROS ABS: 4.1 10*3/uL (ref 1.7–7.7)
Neutrophils Relative %: 59 % (ref 43–77)
PLATELETS: 153 10*3/uL (ref 150–400)
RBC: 4.36 MIL/uL (ref 3.87–5.11)
RDW: 13.2 % (ref 11.5–15.5)
WBC: 7 10*3/uL (ref 4.0–10.5)

## 2014-04-29 LAB — WET PREP, GENITAL
Clue Cells Wet Prep HPF POC: NONE SEEN
YEAST WET PREP: NONE SEEN

## 2014-04-29 LAB — BASIC METABOLIC PANEL
ANION GAP: 13 (ref 5–15)
BUN: 15 mg/dL (ref 6–23)
CHLORIDE: 103 meq/L (ref 96–112)
CO2: 20 meq/L (ref 19–32)
Calcium: 8.9 mg/dL (ref 8.4–10.5)
Creatinine, Ser: 0.72 mg/dL (ref 0.50–1.10)
GFR calc non Af Amer: >90 mL/min (ref >90)
Glucose, Bld: 92 mg/dL (ref 70–99)
POTASSIUM: 3.8 meq/L (ref 3.7–5.3)
Sodium: 136 mEq/L — ABNORMAL LOW (ref 137–147)

## 2014-04-29 LAB — HCG, QUANTITATIVE, PREGNANCY

## 2014-04-29 LAB — PREGNANCY, URINE: PREG TEST UR: NEGATIVE

## 2014-04-29 MED ORDER — LIDOCAINE HCL (PF) 1 % IJ SOLN
INTRAMUSCULAR | Status: AC
  Administered 2014-04-29: 0.9 mL
  Filled 2014-04-29: qty 5

## 2014-04-29 MED ORDER — OXYCODONE-ACETAMINOPHEN 5-325 MG PO TABS
1.0000 | ORAL_TABLET | Freq: Once | ORAL | Status: AC
  Administered 2014-04-29: 1 via ORAL
  Filled 2014-04-29: qty 1

## 2014-04-29 MED ORDER — METRONIDAZOLE 500 MG PO TABS: 2000.0000 mg | ORAL_TABLET | Freq: Once | ORAL | Status: DC

## 2014-04-29 MED ORDER — NAPROXEN 500 MG PO TABS: 500.0000 mg | ORAL_TABLET | Freq: Two times a day (BID) | ORAL | Status: DC

## 2014-04-29 MED ORDER — AZITHROMYCIN 250 MG PO TABS
1000.0000 mg | ORAL_TABLET | Freq: Once | ORAL | Status: AC
  Administered 2014-04-29: 1000 mg via ORAL
  Filled 2014-04-29: qty 4

## 2014-04-29 MED ORDER — CEFTRIAXONE SODIUM 250 MG IJ SOLR
250.0000 mg | Freq: Once | INTRAMUSCULAR | Status: AC
  Administered 2014-04-29: 250 mg via INTRAMUSCULAR
  Filled 2014-04-29: qty 250

## 2014-04-29 NOTE — ED Provider Notes (Signed)
Medical screening examination/treatment/procedure(s) were performed by non-physician practitioner and as supervising physician I was immediately available for consultation/collaboration.    Linwood Dibbles, MD 04/29/14 Adrienne Crawford

## 2014-04-29 NOTE — ED Provider Notes (Signed)
CSN: [id number]     Arrival date & time 04/29/14  1615 History   First MD Initiated Contact with Patient 04/29/14 1622     Chief Complaint  Patient presents with  . Vaginal Bleeding     (Consider location/radiation/quality/duration/timing/severity/associated sxs/prior Treatment) Patient is a 23 y.o. female presenting with vaginal bleeding. The history is provided by the patient.  Vaginal Bleeding Quality:  Heavier than menses, clots and dark red Severity:  Severe Onset quality:  Gradual Duration:  2 weeks Timing:  Constant Progression:  Worsening Chronicity:  New Menstrual history:  Regular Number of pads used:  Every hour Number of tampons used:  One every hour Possible pregnancy: yes   Context: spontaneously   Relieved by:  Nothing Worsened by:  Activity Ineffective treatments:  None tried Associated symptoms: abdominal pain, back pain and nausea   Associated symptoms: no dysuria and no fever   Risk factors: PID, STD, STD exposure and unprotected sex    Adrienne Crawford is a 23 y.o. [id number], LNMP 03/16/14, in August had 2 days of bleeding and then stopped for 4 days. Started bleeding again 8/21 and has continued. Last pap smear with in 2 years and was normal. No birth control. She reports that her current sex partner has been sexually active with other people and that is the way she go PID before when she had GC. She is afraid she may have it again.   Past Medical History  Diagnosis Date  . UTI (urinary tract infection)   . Asthma   . PID (acute pelvic inflammatory disease)   . Pregnant   . Anxiety   . Panic attack   . Depression   . Anemia 02/07/2013   Past Surgical History  Procedure Laterality Date  . Bladder surgery     Family History  Problem Relation Age of Onset  . Cancer Other     Lung, Throat annd Mouth  . Hypertension Paternal Grandfather   . Stroke Paternal Grandfather   . Cancer Maternal Grandmother   . Hypertension Maternal Grandmother   . Cancer  Maternal Grandfather   . Hypertension Father   . Stroke Father   . Cancer Mother   . Hypertension Mother    History  Substance Use Topics  . Smoking status: Current Every Day Smoker -- 4.00 packs/day    Types: Cigarettes  . Smokeless tobacco: Never Used     Comment: Quit smoking with positive preg. test.  . Alcohol Use: Yes     Comment: occassional   OB History   Grav Para Term Preterm Abortions TAB SAB Ect Mult Living   Review of Systems  Constitutional: Negative for fever and chills.  HENT: Negative.   Eyes: Negative for photophobia, redness and visual disturbance.  Respiratory: Negative for cough, chest tightness and shortness of breath.   Cardiovascular: Negative for chest pain and palpitations.  Gastrointestinal: Positive for nausea, vomiting and abdominal pain.  Genitourinary: Positive for vaginal bleeding. Negative for dysuria, urgency and frequency.  Musculoskeletal: Positive for back pain. Negative for neck pain.  Neurological: Positive for light-headedness. Negative for syncope and headaches.  Psychiatric/Behavioral: Negative for confusion. The patient is nervous/anxious (hx of but not on medication at this time).       Allergies  Sulfa antibiotics  Home Medications   Prior to Admission medications   Medication Sig Start Date End Date Taking? Authorizing Provider  acetaminophen (TYLENOL) 325  MG tablet Take 650 mg by mouth every 6 (six) hours as needed for pain.   Yes Historical Provider, MD   BP 143/81  Pulse 91  Temp(Src) 98.7 F (37.1 C) (Oral)  Resp 18  Ht  (1.676 m)  Wt 155 lb 9 oz (70.563 kg)  BMI 25.12 kg/m2  SpO2 100%  LMP 04/15/2014  Breastfeeding? No Physical Exam  Nursing note and vitals reviewed. Constitutional: She is oriented to person, place, and time. She appears well-developed and well-nourished.  HENT:  Head: Normocephalic.  Eyes: Conjunctivae and EOM are normal.  Neck: Neck supple.  Cardiovascular:  Normal rate.   Pulmonary/Chest: Effort normal.  Abdominal: Soft. Bowel sounds are normal. There is tenderness in the right lower quadrant, suprapubic area and left lower quadrant. There is no rebound, no guarding and no CVA tenderness.  Genitourinary:  External genitalia without lesions, moderate blood vaginal vault. Positive CMT, bilateral adnexal tenderness, uterus without palpable enlargement.   Musculoskeletal: Normal range of motion.  Neurological: She is alert and oriented to person, place, and time. No cranial nerve deficit.  Skin: Skin is warm and dry.  Psychiatric: She has a normal mood and affect. Her behavior is normal.   Results for orders placed during the hospital encounter of 04/29/14 (from the past 24 hour(s))  WET PREP, GENITAL     Status: Abnormal   Collection Time    04/29/14  4:59 PM      Result Value Ref Range   Yeast Wet Prep HPF POC NONE SEEN  NONE SEEN   Trich, Wet Prep FEW (*) NONE SEEN   Clue Cells Wet Prep HPF POC NONE SEEN  NONE SEEN   WBC, Wet Prep HPF POC FEW (*) NONE SEEN  PREGNANCY, URINE     Status: None   Collection Time    04/29/14  5:49 PM      Result Value Ref Range   Preg Test, Ur NEGATIVE  NEGATIVE    ED Course  Procedures  US Transvaginal Non-ob  04/29/2014   CLINICAL DATA:  Pelvic pain. History of pelvic inflammatory disease.  EXAM: TRANSABDOMINAL AND TRANSVAGINAL ULTRASOUND OF PELVIS  TECHNIQUE: Both transabdominal and transvaginal ultrasound examinations of the pelvis were performed. Transabdominal technique was performed for global imaging of the pelvis including uterus, ovaries, adnexal regions, and pelvic cul-de-sac. It was necessary to proceed with endovaginal exam following the transabdominal exam to visualize the endometrium and ovaries.  COMPARISON:  None  FINDINGS: Uterus  Measurements: 8.0 x 5.5 x 4.8 cm. Anteverted, anteflexed. No fibroids or other mass visualized.  Endometrium  Thickness: 1.2 cm. Borderline trilaminar in appearance  without focal abnormality visualized.  Right ovary  Measurements: 3.7 x 2.6 x 2.4 cm. Normal appearance/no adnexal mass.  Left ovary  Measurements: 4.6 x 4.0 x 2.9 cm. Probable hemorrhagic cyst with retractile peripheral clot measuring 2.8 x 2.7 x 1.9 cm.  Other findings  Small free fluid in the cul de sac  IMPRESSION: Probable hemorrhagic left ovarian cyst. If the patient has left pelvic pain, consider followup pelvic ultrasound in 4-6 weeks during the week following the patient's menses to determine resolution.   Electronically Signed   By: Christiana Pellant M.D.   On: 04/29/2014 17:51   US Pelvis Complete  04/29/2014   CLINICAL DATA:  Pelvic pain. History of pelvic inflammatory disease.  EXAM: TRANSABDOMINAL AND TRANSVAGINAL ULTRASOUND OF PELVIS  TECHNIQUE: Both transabdominal and transvaginal ultrasound examinations of the pelvis were performed. Transabdominal technique was performed for  global imaging of the pelvis including uterus, ovaries, adnexal regions, and pelvic cul-de-sac. It was necessary to proceed with endovaginal exam following the transabdominal exam to visualize the endometrium and ovaries.  COMPARISON:  None  FINDINGS: Uterus  Measurements: 8.0 x 5.5 x 4.8 cm. Anteverted, anteflexed. No fibroids or other mass visualized.  Endometrium  Thickness: 1.2 cm. Borderline trilaminar in appearance without focal abnormality visualized.  Right ovary  Measurements: 3.7 x 2.6 x 2.4 cm. Normal appearance/no adnexal mass.  Left ovary  Measurements: 4.6 x 4.0 x 2.9 cm. Probable hemorrhagic cyst with retractile peripheral clot measuring 2.8 x 2.7 x 1.9 cm.  Other findings  Small free fluid in the cul de sac  IMPRESSION: Probable hemorrhagic left ovarian cyst. If the patient has left pelvic pain, consider followup pelvic ultrasound in 4-6 weeks during the week following the patient's menses to determine resolution.   Electronically Signed   By: Christiana Pellant M.D.   On: 04/29/2014 17:51    MDM  23 y.o. female  with abnormal vaginal bleeding and pelvic pain x 2 weeks. She is concerned about PID and STD's because she is feeling similar to when she had GC and PID in the past. She is with the same partner and he has other partners. Wet prep show trichomonas. Ultrasound shows hemorrhagic cyst on the left. I discussed results thus far with the patient and plan of care. Will treat with Rocephin 250 mg IM, Zithromax 1 gram PO now. Cultures for GC and Chlamydia pending. Blood work for HIV and syphilis pending.  Dr. Roselyn Bering to assume care of the patient and will d/c patient with Flagyl for the treatment of the trichomonas and will discuss results of CBC and BMET when they are back.      Janne Napoleon, [place] 04/29/14 1815

## 2014-04-29 NOTE — Discharge Instructions (Signed)
Ovarian Cyst An ovarian cyst is a fluid-filled sac that forms on an ovary. The ovaries are small organs that produce eggs in women. Various types of cysts can form on the ovaries. Most are not cancerous. Many do not cause problems, and they often go away on their own. Some may cause symptoms and require treatment. Common types of ovarian cysts include:  Functional cysts--These cysts may occur every month during the menstrual cycle. This is normal. The cysts usually go away with the next menstrual cycle if the woman does not get pregnant. Usually, there are no symptoms with a functional cyst.  Endometrioma cysts--These cysts form from the tissue that lines the uterus. They are also called "chocolate cysts" because they become filled with blood that turns [place]. This type of cyst can cause pain in the lower abdomen during intercourse and with your menstrual period.  Cystadenoma cysts--This type develops from the cells on the outside of the ovary. These cysts can get very big and cause lower abdomen pain and pain with intercourse. This type of cyst can twist on itself, cut off its blood supply, and cause severe pain. It can also easily rupture and cause a lot of pain.  Dermoid cysts--This type of cyst is sometimes found in both ovaries. These cysts may contain different kinds of body tissue, such as skin, teeth, hair, or cartilage. They usually do not cause symptoms unless they get very big.  Theca lutein cysts--These cysts occur when too much of a certain hormone (human chorionic gonadotropin) is produced and overstimulates the ovaries to produce an egg. This is most common after procedures used to assist with the conception of a baby (in vitro fertilization). CAUSES   Fertility drugs can cause a condition in which multiple large cysts are formed on the ovaries. This is called ovarian hyperstimulation syndrome.  A condition called polycystic ovary syndrome can cause hormonal imbalances that can lead to  nonfunctional ovarian cysts. SIGNS AND SYMPTOMS  Many ovarian cysts do not cause symptoms. If symptoms are present, they may include:  Pelvic pain or pressure.  Pain in the lower abdomen.  Pain during sexual intercourse.  Increasing girth (swelling) of the abdomen.  Abnormal menstrual periods.  Increasing pain with menstrual periods.  Stopping having menstrual periods without being pregnant. DIAGNOSIS  These cysts are commonly found during a routine or annual pelvic exam. Tests may be ordered to find out more about the cyst. These tests may include:  Ultrasound.  X-ray of the pelvis.  CT scan.  MRI.  Blood tests. TREATMENT  Many ovarian cysts go away on their own without treatment. Your health care provider may want to check your cyst regularly for 2-3 months to see if it changes. For women in menopause, it is particularly important to monitor a cyst closely because of the higher rate of ovarian cancer in menopausal women. When treatment is needed, it may include any of the following:  A procedure to drain the cyst (aspiration). This may be done using a long needle and ultrasound. It can also be done through a laparoscopic procedure. This involves using a thin, lighted tube with a tiny camera on the end (laparoscope) inserted through a small incision.  Surgery to remove the whole cyst. This may be done using laparoscopic surgery or an open surgery involving a larger incision in the lower abdomen.  Hormone treatment or birth control pills. These methods are sometimes used to help dissolve a cyst. HOME CARE INSTRUCTIONS   Only take over-the-counter  or prescription medicines as directed by your health care provider. °· Follow up with your health care provider as directed. °· Get regular pelvic exams and Pap tests. °SEEK MEDICAL CARE IF:  °· Your periods are late, irregular, or painful, or they stop. °· Your pelvic pain or abdominal pain does not go away. °· Your abdomen becomes  larger or swollen. °· You have pressure on your bladder or trouble emptying your bladder completely. °· You have pain during sexual intercourse. °· You have feelings of fullness, pressure, or discomfort in your stomach. °· You lose weight for no apparent reason. °· You feel generally ill. °· You become constipated. °· You lose your appetite. °· You develop acne. °· You have an increase in body and facial hair. °· You are gaining weight, without changing your exercise and eating habits. °· You think you are pregnant. °SEEK IMMEDIATE MEDICAL CARE IF:  °· You have increasing abdominal pain. °· You feel sick to your stomach (nauseous), and you throw up (vomit). °· You develop a fever that comes on suddenly. °· You have abdominal pain during a bowel movement. °· Your menstrual periods become heavier than usual. °MAKE SURE YOU: °· Understand these instructions. °· Will watch your condition. °· Will get help right away if you are not doing well or get worse. °Document Released: 08/09/2005 Document Revised: 08/14/2013 Document Reviewed: 04/16/2013 °[employer]® Patient Information ©2015 [employer]. This information is not intended to replace advice given to you by your health care provider. Make sure you discuss any questions you have with your health care provider. °Trichomoniasis °Trichomoniasis is an infection caused by an organism called Trichomonas. The infection can affect both women and men. In women, the outer female genitalia and the vagina are affected. In men, the penis is mainly affected, but the prostate and other reproductive organs can also be involved. Trichomoniasis is a sexually transmitted infection (STI) and is most often passed to another person through sexual contact.  °RISK FACTORS °· Having unprotected sexual intercourse. °· Having sexual intercourse with an infected partner. °SIGNS AND SYMPTOMS  °Symptoms of trichomoniasis in women include: °· Abnormal gray-green frothy vaginal discharge. °· Itching  and irritation of the vagina. °· Itching and irritation of the area outside the vagina. °Symptoms of trichomoniasis in men include:  °· Penile discharge with or without pain. °· Pain during urination. This results from inflammation of the urethra. °DIAGNOSIS  °Trichomoniasis may be found during a Pap test or physical exam. Your health care provider may use one of the following methods to help diagnose this infection: °· Examining vaginal discharge under a microscope. For men, urethral discharge would be examined. °· Testing the pH of the vagina with a test tape. °· Using a vaginal swab test that checks for the Trichomonas organism. A test is available that provides results within a few minutes. °· Doing a culture test for the organism. This is not usually needed. °TREATMENT  °· You may be given medicine to fight the infection. Women should inform their health care provider if they could be or are pregnant. Some medicines used to treat the infection should not be taken during pregnancy. °· Your health care provider may recommend over-the-counter medicines or creams to decrease itching or irritation. °· Your sexual partner will need to be treated if infected. °HOME CARE INSTRUCTIONS  °· Take medicines only as directed by your health care provider. °· Take over-the-counter medicine for itching or irritation as directed by your health care provider. °· Do   Do not have sexual intercourse while you have the infection.  Women should not douche or wear tampons while they have the infection.  Discuss your infection with your partner. Your partner may have gotten the infection from you, or you may have gotten it from your partner.  Have your sex partner get examined and treated if necessary.  Practice safe, informed, and protected sex.  See your health care provider for other STI testing. SEEK MEDICAL CARE IF:   You still have symptoms after you finish your medicine.  You develop abdominal  pain.  You have pain when you urinate.  You have bleeding after sexual intercourse.  You develop a rash.  Your medicine makes you sick or makes you throw up (vomit). MAKE SURE YOU:  Understand these instructions.  Will watch your condition.  Will get help right away if you are not doing well or get worse. Document Released: 02/02/2001 Document Revised: 12/24/2013 Document Reviewed: 05/21/2013 [facility] Patient Information 2015 [place], [place]. This information is not intended to replace advice given to you by your health care provider. Make sure you discuss any questions you have with your health care provider.

## 2014-04-29 NOTE — ED Notes (Addendum)
Pt reports vaginal bleeding x3 weeks and lower abdominal pain. Pt reports n/v/dizziness. Pt reports period came two weeks early. pt reports recently vaginal bleeding has increased with "clots".

## 2014-04-29 NOTE — ED Notes (Signed)
Provider assessment done prior to nurse assessment.

## 2014-04-30 LAB — HIV ANTIBODY (ROUTINE TESTING W REFLEX): HIV 1&2 Ab, 4th Generation: NONREACTIVE

## 2014-04-30 LAB — GC/CHLAMYDIA PROBE AMP
CT Probe RNA: POSITIVE — AB
GC PROBE AMP APTIMA: NEGATIVE

## 2014-04-30 LAB — RPR

## 2014-05-01 ENCOUNTER — Telehealth (HOSPITAL_BASED_OUTPATIENT_CLINIC_OR_DEPARTMENT_OTHER): Payer: Self-pay | Admitting: Emergency Medicine

## 2014-05-01 NOTE — Telephone Encounter (Signed)
Post ED Visit - Positive Culture Follow-up  Culture report reviewed by antimicrobial stewardship pharmacist:  Wes Dulaney, Pharm.D., BCPS  Celedonio Miyamoto, [address].D., BCPS  Georgina Pillion, Pharm.D., BCPS  [place], [address].D., BCPS, AAHIVP  Estella Husk, Pharm.D., BCPS, AAHIVP  Carly Sabat, Pharm.D.  Enzo Bi, Pharm.D.  Positive chlamydia culture Treated with rocephin, zithromax, organism sensitive to the same and no further patient follow-up is required at this time.  Berle Mull 05/01/2014, 12:14 PM

## 2014-05-02 ENCOUNTER — Telehealth (HOSPITAL_BASED_OUTPATIENT_CLINIC_OR_DEPARTMENT_OTHER): Payer: Self-pay | Admitting: Emergency Medicine

## 2014-05-02 NOTE — Telephone Encounter (Signed)
Post ED Visit - Positive Culture Follow-up  Culture report reviewed by antimicrobial stewardship pharmacist:  Wes Dulaney, Pharm.D., BCPS  Celedonio Miyamoto, [address].D., BCPS  Georgina Pillion, Pharm.D., BCPS  [place], [address].D., BCPS, AAHIVP  Estella Husk, Pharm.D., BCPS, AAHIVP  Carly Sabat, Pharm.D.  Enzo Bi, Pharm.D.  Positive chlamydia culture Treated with rocephin and zithromax, organism sensitive to the same and no further patient follow-up is required at this time.  Berle Mull 05/02/2014, 2:41 PM

## 2014-05-12 ENCOUNTER — Telehealth (HOSPITAL_COMMUNITY): Payer: Self-pay

## 2014-05-19 ENCOUNTER — Telehealth (HOSPITAL_BASED_OUTPATIENT_CLINIC_OR_DEPARTMENT_OTHER): Payer: Self-pay | Admitting: Emergency Medicine

## 2014-05-19 NOTE — Telephone Encounter (Signed)
ID Verified, pt notified of + chlamydia and that treatment was given in ED with Zithromax. STD instructions provided, patient verbalized understanding.

## 2014-06-24 ENCOUNTER — Encounter (HOSPITAL_COMMUNITY): Payer: Self-pay | Admitting: Emergency Medicine

## 2014-08-28 ENCOUNTER — Encounter (HOSPITAL_COMMUNITY): Payer: Self-pay | Admitting: *Deleted

## 2014-08-28 ENCOUNTER — Emergency Department (HOSPITAL_COMMUNITY)
Admission: EM | Admit: 2014-08-28 | Discharge: 2014-08-29 | Disposition: A | Discharge status: Home / Self Care | Payer: Medicaid Other | Attending: Emergency Medicine | Admitting: Emergency Medicine

## 2014-08-28 DIAGNOSIS — Z791 Long term (current) use of non-steroidal anti-inflammatories (NSAID): Secondary | ICD-10-CM | POA: Insufficient documentation

## 2014-08-28 DIAGNOSIS — J45909 Unspecified asthma, uncomplicated: Secondary | ICD-10-CM | POA: Diagnosis not present

## 2014-08-28 DIAGNOSIS — Z720 Tobacco use: Secondary | ICD-10-CM | POA: Diagnosis not present

## 2014-08-28 DIAGNOSIS — Z8742 Personal history of other diseases of the female genital tract: Secondary | ICD-10-CM | POA: Insufficient documentation

## 2014-08-28 DIAGNOSIS — Z87440 Personal history of urinary (tract) infections: Secondary | ICD-10-CM | POA: Insufficient documentation

## 2014-08-28 DIAGNOSIS — Z792 Long term (current) use of antibiotics: Secondary | ICD-10-CM | POA: Insufficient documentation

## 2014-08-28 DIAGNOSIS — M25571 Pain in right ankle and joints of right foot: Secondary | ICD-10-CM | POA: Insufficient documentation

## 2014-08-28 DIAGNOSIS — Z8659 Personal history of other mental and behavioral disorders: Secondary | ICD-10-CM | POA: Insufficient documentation

## 2014-08-28 DIAGNOSIS — Z862 Personal history of diseases of the blood and blood-forming organs and certain disorders involving the immune mechanism: Secondary | ICD-10-CM | POA: Diagnosis not present

## 2014-08-28 DIAGNOSIS — R52 Pain, unspecified: Secondary | ICD-10-CM

## 2014-08-28 DIAGNOSIS — M25471 Effusion, right ankle: Secondary | ICD-10-CM | POA: Diagnosis present

## 2014-08-28 NOTE — ED Notes (Signed)
Pt noticed that her right ankle is swollen and pt denies injury.

## 2014-08-29 ENCOUNTER — Emergency Department (HOSPITAL_COMMUNITY): Payer: Medicaid Other

## 2014-08-29 MED ORDER — IBUPROFEN 600 MG PO TABS: 600.0000 mg | ORAL_TABLET | Freq: Four times a day (QID) | ORAL | Status: DC | PRN

## 2014-08-29 MED ORDER — IBUPROFEN 800 MG PO TABS
800.0000 mg | ORAL_TABLET | Freq: Once | ORAL | Status: AC
  Administered 2014-08-29: 800 mg via ORAL
  Filled 2014-08-29: qty 1

## 2014-08-29 NOTE — Discharge Instructions (Signed)
Ankle Pain °Ankle pain is a common symptom. The bones, cartilage, tendons, and muscles of the ankle joint perform a lot of work each day. The ankle joint holds your body weight and allows you to move around. Ankle pain can occur on either side or back of 1 or both ankles. Ankle pain may be sharp and burning or dull and aching. There may be tenderness, stiffness, redness, or warmth around the ankle. The pain occurs more often when a person walks or puts pressure on the ankle. °CAUSES  °There are many reasons ankle pain can develop. It is important to work with your caregiver to identify the cause since many conditions can impact the bones, cartilage, muscles, and tendons. Causes for ankle pain include: °· Injury, including a break (fracture), sprain, or strain often due to a fall, sports, or a high-impact activity. °· Swelling (inflammation) of a tendon (tendonitis). °· Achilles tendon rupture. °· Ankle instability after repeated sprains and strains. °· Poor foot alignment. °· Pressure on a nerve (tarsal tunnel syndrome). °· Arthritis in the ankle or the lining of the ankle. °· Crystal formation in the ankle (gout or pseudogout). °DIAGNOSIS  °A diagnosis is based on your medical history, your symptoms, results of your physical exam, and results of diagnostic tests. Diagnostic tests may include X-ray exams or a computerized magnetic scan (magnetic resonance imaging, MRI). °TREATMENT  °Treatment will depend on the cause of your ankle pain and may include: °· Keeping pressure off the ankle and limiting activities. °· Using crutches or other walking support (a cane or brace). °· Using rest, ice, compression, and elevation. °· Participating in physical therapy or home exercises. °· Wearing shoe inserts or special shoes. °· Losing weight. °· Taking medications to reduce pain or swelling or receiving an injection. °· Undergoing surgery. °HOME CARE INSTRUCTIONS  °· Only take over-the-counter or prescription medicines for  pain, discomfort, or fever as directed by your caregiver. °· Put ice on the injured area. °¨ Put ice in a plastic bag. °¨ Place a towel between your skin and the bag. °¨ Leave the ice on for 15-20 minutes at a time, 03-04 times a day. °· Keep your leg raised (elevated) when possible to lessen swelling. °· Avoid activities that cause ankle pain. °· Follow specific exercises as directed by your caregiver. °· Record how often you have ankle pain, the location of the pain, and what it feels like. This information may be helpful to you and your caregiver. °· Ask your caregiver about returning to work or sports and whether you should drive. °· Follow up with your caregiver for further examination, therapy, or testing as directed. °SEEK MEDICAL CARE IF:  °· Pain or swelling continues or worsens beyond 1 week. °· You have an oral temperature above 102° F (38.9° C). °· You are feeling unwell or have chills. °· You are having an increasingly difficult time with walking. °· You have loss of sensation or other new symptoms. °· You have questions or concerns. °MAKE SURE YOU:  °· Understand these instructions. °· Will watch your condition. °· Will get help right away if you are not doing well or get worse. °Document Released: 01/27/2010 Document Revised: 11/01/2011 Document Reviewed: 01/27/2010 °ExitCare® Patient Information ©2015 [employer]. This information is not intended to replace advice given to you by your health care provider. Make sure you discuss any questions you have with your health care provider. ° ° °Emergency Department Resource Guide °1) Find a Doctor and Pay Out of   Pocket °Although you won't have to find out who is covered by your insurance plan, it is a good idea to ask around and get recommendations. You will then need to call the office and see if the doctor you have chosen will accept you as a new patient and what types of options they offer for patients who are self-pay. Some doctors offer discounts or  will set up payment plans for their patients who do not have insurance, but you will need to ask so you aren't surprised when you get to your appointment. ° °2) Contact Your Local Health Department °Not all health departments have doctors that can see patients for sick visits, but many do, so it is worth a call to see if yours does. If you don't know where your local health department is, you can check in your phone book. The CDC also has a tool to help you locate your state's health department, and many state websites also have listings of all of their local health departments. ° °3) Find a Walk-in Clinic °If your illness is not likely to be very severe or complicated, you may want to try a walk in clinic. These are popping up all over the country in pharmacies, drugstores, and shopping centers. They're usually staffed by nurse practitioners or physician assistants that have been trained to treat common illnesses and complaints. They're usually fairly quick and inexpensive. However, if you have serious medical issues or chronic medical problems, these are probably not your best option. ° °No Primary Care Doctor: °- Call Health Connect at  832-8000 - they can help you locate a primary care doctor that  accepts your insurance, provides certain services, etc. °- Physician Referral Service- 1-800-533-3463 ° °Chronic Pain Problems: °Organization         Address  Phone   Notes  °[facility]  ([phone] Patients need to be referred by their primary care doctor.  ° °Medication Assistance: °Organization         Address  Phone   Notes  °[place] [facility] [address] °[place], [place] [address] [phone]) 641-8030 --Must be a resident of [place] °-- Must have NO insurance coverage whatsoever (no Medicaid/ Medicare, etc.) °-- The pt. MUST have a primary care doctor that directs their care regularly and follows them in the community °  °[facility]  (866)  331-1348   °United Way  (888) 892-1162   ° °Agencies that provide inexpensive medical care: °Organization         Address  Phone   Notes  °[facility]  ([phone]) [phone]   °[facility]    ([phone]) 832-7272   °[facility] [address] °[place], [place] [address] (336) [phone]   °[facility] [address], °[place] ([phone]) [phone]   °[facility]anned [facility]    (336) 373-0678   °[facility]    (336) 272-1050   °[facility] ° [address], [place] Phone:  (336) 832-4444, Fax:  (336) [phone] Hours of Operation:  9 am - 6 pm, M-F.  Also accepts Medicaid/Medicare and self-pay.  °[facility] ° [address], [place] Phone: (336) [phone], Fax: ([phone]) [phone]. Hours of Operation:  8:30 am - 5:30 pm, M-F.  Also accepts Medicaid and self-pay.  °[facility] [place] [address], [place] Phone: (336) [phone]   °[facility] [address] [place], [place] [phone], Ext. [phone] Mondays & Thursdays: 7-9 AM.  First 15 patients are seen on a first come, first serve basis.    [place] Providers:  Organization         Address  Phone   Notes  [facility] [address], [place] [phone] Also accepts self-pay patients.  [facility] [address], [place]  [phone]   [place], [address], [place] [phone]   [facility] [address], [place] [phone]   Lucianne Lei [address], [place]   [phone] Only accepts [place] Access [place] patients after they have their name applied to their card.   Self-Pay (no insurance) in [facility]:  Organization         Address  Phone   Notes  Sickle Cell Patients, [facility] [place] [phone]   [facility] [place] [phone]   [facility] [place]  [place], [place], [place] [phone]   [facility]/Dr. Osei-Bonsu  [address] [place] or [address], [place] [phone] Phone number for both [place] and [place] locations is the same.  [facility] [address] [place] [phone]   [facility] [address], [place] or [address] [phone]   [facility] [address] [place] [phone], phone; [phone], fax Sees patients 1st and 3rd Saturday of every month.  Must not qualify for public or private insurance (i.e. Medicaid, Medicare, [place] Health Choice, Veterans' Benefits)  Household income should be no more than 200% of the poverty level The clinic cannot treat you if you are pregnant or think you are pregnant  Sexually transmitted diseases are not treated at the clinic.    Dental Care: Organization         Address  Phone  Notes  [facility] [place] [phone] Accepts children up to age 40 who are enrolled in [place] or [place]; pregnant women with a Medicaid card; and children who have applied for Medicaid or [place] Health Choice, but were declined, whose parents can pay a reduced fee at time of service.  [facility] Department of [facility]  [address], [place] [phone] Accepts children up to age 51 who are enrolled in [place] or [place]; pregnant women with a Medicaid card; and children who have applied for Medicaid or  Health Choice, but were declined, whose parents can pay a reduced fee at time of service.  [facility]  [place] [phone] Patients are seen by appointment only. Walk-ins are not accepted. [place] will see patients 66 years of age and older. Monday - Tuesday (8am-5pm) Most Wednesdays (8:30-5pm) $30 per visit, cash only  [facility] Adult Dental Access PRO[facility]  [address], [facility] [phone] Patients are seen by appointment only. Walk-ins are not accepted. [place] will see patients 38 years of age and older. One Wednesday Evening (Monthly: Volunteer Based).  $30 per visit, cash only  [place]  [phone] for adults; Children under age 43, call Graduate Pediatric Dentistry at [phone]. Children aged 60-14, please call [phone] to request a pediatric application.  Dental services are provided in all areas of dental care including fillings, crowns and bridges, complete and partial dentures, implants, gum treatment, root canals, and extractions. Preventive care is also provided. Treatment is provided to both adults and children. Patients are selected via a lottery and there is often a waiting list.   [facility] [address] [place]  [phone] [email or web address]   [facility] [address] [place], [place] [phone], Ext. [phone] Second and Fourth Thursday of each month, opens at 6:30 AM; Clinic ends at 9 AM.  Patients are seen on a first-come first-served basis, and a limited number are seen during each clinic.   [facility]  [address] Hillard Danker [place], [place] [phone]   Eligibility Requirements You must have lived in [place], [place], or [place] counties for at least the last three months.   You cannot be eligible for state or federal sponsored [employer], including [employer], [place], or [employer].   You generally cannot be eligible for healthcare insurance through your employer.    How to apply: Eligibility screenings are held every Tuesday and Wednesday afternoon  from 1:00 pm until 4:00 pm. You do not need an appointment for the interview!  [facility] [address], [place], [place]   [place]  [place] Department  [place]  [phone]    Behavioral Health Resources in the Community: Intensive Outpatient Programs Organization         Address  Phone  Notes  Nelson Bon[place]. [address], [place], [place] [phone]   [facility] Outpatient [address] [place], [place]   [facility] [address], [place], [place]   [place] [address],  [place], [place] or [phone]   Substance Abuse Resources Organization         Address  Phone  Notes  [facility] Drug [facility]  [phone]   [place]  [phone]   [facility] [place]   Chinita Pester  (916)227-8381   Residential & Outpatient Substance Abuse Program  [phone]   Psychological Services Organization         Address  Phone  Notes  [facility] [place]  [place]  [phone]   [place] [address], [place] or [phone]    Mobile Crisis Teams Organization         Address  Phone  Notes  [facility], Mobile Crisis Care Unit  ([phone])[phone]   [facility]  [address] [place], [place]   [place] [address] [place] [phone]    Self-Help/Support Groups Organization         Address  Phone             Notes  [place]. of [place] - variety of support groups  [place] Call for more information  Narcotics Anonymous (NA), [facility] [address], [employer] [place]  2 meetings at this location   Materials engineer  Address  Phone  Notes  [facility] [address],    [place]  [phone]   [facility]  [address], [place] [phone], [place], [place] [phone]   [facility] [address], [place] [phone] Admissions: 8am-3pm M-F  [facility] [address]    [place], [place] [phone]   [facility] [address], [place], [place] [phone]   [facility] [address]  [place], [place] [phone]   [facility] - Intensive Outpatient [address], [place], [place] [phone]   [facility] [address]  [place], [place] [phone] or [phone]   [facility] ([facility]) [address] [place], [place] [phone] Accepts Medicaid  [facility] [address]  [place] [phone] Substance Abuse/Addiction Treatment   [facility] [place] [place] Behavioral Health Resources Organization         Address  Phone  Notes  [facility]  [phone](888) [phone]   Angie FavaJulie Brannon, PhD [address], [address] [place], [place]   [phone]) [phone] or [phone](336) [phone]   [facility]   [address] [place], [place] [phone](336) [phone]   [facility] [address], [place], [place] [phone]) [phone] Insurance/Medicaid/sponsorship through [facility]  [facility] [address]                                    [place], [place] [phone]) [phone] Therapy/tele-psych/case  [facility] [address].   [place], [place] [phone]    Dr. Lolly MustacheArfeen  [phone]   [facility]  United Way [facility] 1) [address], [place] 2) [address], [place] 3)  [address], [place] [phone](336) [phone]  [phone]) [phone]   [facility] Child Abuse Hotline [phone](336) 7576182242 or ([phone](336) 6183807168 (After Hours)        Your  xrays are negative tonight.  Elevate your ankle and use ice for 10 minutes every hour while awake for the next several days.  Use ibuprofen for pain and swelling.  Your xrays are negative for any obvious source of pain. See the resource guide above for assistance locating a primary doctor.  Have your ankle rechecked if not improved with treatment.

## 2014-08-29 NOTE — ED Provider Notes (Signed)
CSN: [id number]     Arrival date & time 08/28/14  2247 History   First MD Initiated Contact with Patient 08/28/14 2343     Chief Complaint  Patient presents with  . Joint Swelling     (Consider location/radiation/quality/duration/timing/severity/associated sxs/prior Treatment) The history is provided by the patient.   Adrienne Crawford is a 24 y.o. female presenting with pain and swelling along her medial distal achilles tendon of her right lower extremity which flared up yesterday.  She describes similar symptoms which occurred one month ago and resolved spontaneously after about 3 days.  She denies injury.  She works part time as a Leisure centre managerbartender on weekends, states does not work more than 5 hours daily and wears flat comfortable shoes for this activity.  She denies fevers, chills, rash, shortness of breath and chest pain.  No recent extended travel or rest.  She has taken no medicines prior to arrival. She applied a heating pad which made the pain worse.    Past Medical History  Diagnosis Date  . UTI (urinary tract infection)   . Asthma   . PID (acute pelvic inflammatory disease)   . Pregnant   . Anxiety   . Panic attack   . Depression   . Anemia 02/07/2013   Past Surgical History  Procedure Laterality Date  . Bladder surgery     Family History  Problem Relation Age of Onset  . Cancer Other     Lung, Throat annd Mouth  . Hypertension Paternal Grandfather   . Stroke Paternal Grandfather   . Cancer Maternal Grandmother   . Hypertension Maternal Grandmother   . Cancer Maternal Grandfather   . Hypertension Father   . Stroke Father   . Cancer Mother   . Hypertension Mother    History  Substance Use Topics  . Smoking status: Current Every Day Smoker -- 4.00 packs/day    Types: Cigarettes  . Smokeless tobacco: Never Used     Comment: Quit smoking with positive preg. test.  . Alcohol Use: Yes     Comment: occassional   OB History    Gravida Para Term Preterm AB TAB SAB  Ectopic Multiple Living   3 2 2  1  1   2      Review of Systems  Constitutional: Negative for fever.  Musculoskeletal: Positive for joint swelling and arthralgias. Negative for myalgias.  Neurological: Negative for weakness and numbness.      Allergies  Sulfa antibiotics  Home Medications   Prior to Admission medications   Medication Sig Start Date End Date Taking? Authorizing Provider  acetaminophen (TYLENOL) 325 MG tablet Take 650 mg by mouth every 6 (six) hours as needed for pain.    Historical Provider, MD  ibuprofen (ADVIL,MOTRIN) 600 MG tablet Take 1 tablet (600 mg total) by mouth every 6 (six) hours as needed. 08/29/14   Burgess AmorJulie Jasmin Trumbull, PA-C  metroNIDAZOLE (FLAGYL) 500 MG tablet Take 4 tablets (2,000 mg total) by mouth once. 04/29/14   Linwood DibblesJon Knapp, MD  naproxen (NAPROSYN) 500 MG tablet Take 1 tablet (500 mg total) by mouth 2 (two) times daily. 04/29/14   Linwood DibblesJon Knapp, MD   BP 115/64 mmHg  Pulse 91  Temp(Src) 98.4 F (36.9 C) (Oral)  Resp 20  Ht 5\' 6"  (1.676 m)  Wt 169 lb (76.658 kg)  BMI 27.29 kg/m2  SpO2 100%  LMP 08/28/2014 Physical Exam  Constitutional: She appears well-developed and well-nourished.  HENT:  Head: Atraumatic.  Neck: Normal range of  motion.  Cardiovascular:  Pulses equal bilaterally  Musculoskeletal: She exhibits edema and tenderness.       Right ankle: She exhibits normal range of motion, no ecchymosis, no deformity and normal pulse. No tenderness. No proximal fibula tenderness found. Achilles tendon exhibits pain. Achilles tendon exhibits no defect and normal Thompson's test results.  Soft, localized fullness and tenderness right medial ankle along achilles tendon. Tendon intact without defect. Calf soft, nontender. Dorsalis pedal pulse intact. Less than 3 sec cap refill in toes.  Neurological: She is alert. She has normal strength. She displays normal reflexes. No sensory deficit.  Skin: Skin is warm and dry. No rash noted.  Psychiatric: She has a normal  mood and affect.    ED Course  Procedures (including critical care time) Labs Review Labs Reviewed - No data to display  Imaging Review Dg Ankle Complete Right  08/29/2014   CLINICAL DATA:  Acute onset of generalized right ankle pain and right ankle swelling. Initial encounter.  EXAM: RIGHT ANKLE - COMPLETE 3+ VIEW  COMPARISON:  None.  FINDINGS: There is no evidence of fracture or dislocation. The ankle mortise is intact; the interosseous space is within normal limits. No talar tilt or subluxation is seen.  The joint spaces are preserved. No significant soft tissue abnormalities are seen.  IMPRESSION: No evidence of fracture or dislocation.   Electronically Signed   By: Roanna Raider M.D.   On: 08/29/2014 01:10     EKG Interpretation None      MDM   Final diagnoses:  Pain  Acute ankle pain, right    Localized medial ankle pain along achilles edge with no injury, no deformity or tendon disruption.  Non specific sts on exam. No visible sign of injury.  xrays reviewed and negative. Advised ice, elevation, ibuprofen. F/u with pcp (referrals given) if sx do not improve with tx.    Burgess Amor, PA-C 08/29/14 0136  Loren Racer, MD 08/30/14 [phone]

## 2016-02-20 ENCOUNTER — Emergency Department (HOSPITAL_COMMUNITY)
Admission: EM | Admit: 2016-02-20 | Discharge: 2016-02-21 | Disposition: A | Discharge status: Home / Self Care | Payer: Medicaid Other | Attending: Emergency Medicine | Admitting: Emergency Medicine

## 2016-02-20 ENCOUNTER — Encounter (HOSPITAL_COMMUNITY): Payer: Self-pay

## 2016-02-20 DIAGNOSIS — J45909 Unspecified asthma, uncomplicated: Secondary | ICD-10-CM | POA: Insufficient documentation

## 2016-02-20 DIAGNOSIS — F17210 Nicotine dependence, cigarettes, uncomplicated: Secondary | ICD-10-CM | POA: Insufficient documentation

## 2016-02-20 DIAGNOSIS — R509 Fever, unspecified: Secondary | ICD-10-CM | POA: Insufficient documentation

## 2016-02-20 DIAGNOSIS — F419 Anxiety disorder, unspecified: Secondary | ICD-10-CM | POA: Diagnosis not present

## 2016-02-20 DIAGNOSIS — R079 Chest pain, unspecified: Secondary | ICD-10-CM | POA: Diagnosis not present

## 2016-02-20 DIAGNOSIS — R55 Syncope and collapse: Secondary | ICD-10-CM | POA: Insufficient documentation

## 2016-02-20 DIAGNOSIS — R61 Generalized hyperhidrosis: Secondary | ICD-10-CM | POA: Insufficient documentation

## 2016-02-20 DIAGNOSIS — R197 Diarrhea, unspecified: Secondary | ICD-10-CM | POA: Diagnosis not present

## 2016-02-20 DIAGNOSIS — Z791 Long term (current) use of non-steroidal anti-inflammatories (NSAID): Secondary | ICD-10-CM | POA: Diagnosis not present

## 2016-02-20 DIAGNOSIS — F329 Major depressive disorder, single episode, unspecified: Secondary | ICD-10-CM | POA: Insufficient documentation

## 2016-02-20 DIAGNOSIS — R300 Dysuria: Secondary | ICD-10-CM | POA: Insufficient documentation

## 2016-02-20 LAB — CBC WITH DIFFERENTIAL/PLATELET
BASOS ABS: 0 10*3/uL (ref 0.0–0.1)
BASOS PCT: 0 %
Eosinophils Absolute: 0.2 10*3/uL (ref 0.0–0.7)
Eosinophils Relative: 3 %
HCT: 42.8 % (ref 36.0–46.0)
Hemoglobin: 14.7 g/dL (ref 12.0–15.0)
LYMPHS PCT: 34 %
Lymphs Abs: 2.3 10*3/uL (ref 0.7–4.0)
MCH: 33.8 pg (ref 26.0–34.0)
MCHC: 34.3 g/dL (ref 30.0–36.0)
MCV: 98.4 fL (ref 78.0–100.0)
MONO ABS: 0.5 10*3/uL (ref 0.1–1.0)
Monocytes Relative: 7 %
NEUTROS ABS: 3.8 10*3/uL (ref 1.7–7.7)
NEUTROS PCT: 56 %
PLATELETS: 217 10*3/uL (ref 150–400)
RBC: 4.35 MIL/uL (ref 3.87–5.11)
RDW: 12.9 % (ref 11.5–15.5)
WBC: 6.7 10*3/uL (ref 4.0–10.5)

## 2016-02-20 LAB — URINALYSIS, ROUTINE W REFLEX MICROSCOPIC
Glucose, UA: NEGATIVE mg/dL
Leukocytes, UA: NEGATIVE
NITRITE: NEGATIVE
PH: 6 (ref 5.0–8.0)
PROTEIN: 100 mg/dL — AB
Specific Gravity, Urine: 1.02 (ref 1.005–1.030)

## 2016-02-20 LAB — BASIC METABOLIC PANEL
ANION GAP: 8 (ref 5–15)
BUN: 15 mg/dL (ref 6–20)
CO2: 26 mmol/L (ref 22–32)
Calcium: 8.6 mg/dL — ABNORMAL LOW (ref 8.9–10.3)
Chloride: 103 mmol/L (ref 101–111)
Creatinine, Ser: 0.86 mg/dL (ref 0.44–1.00)
GLUCOSE: 117 mg/dL — AB (ref 65–99)
POTASSIUM: 3.3 mmol/L — AB (ref 3.5–5.1)
Sodium: 137 mmol/L (ref 135–145)

## 2016-02-20 LAB — URINE MICROSCOPIC-ADD ON

## 2016-02-20 LAB — PREGNANCY, URINE: Preg Test, Ur: NEGATIVE

## 2016-02-20 NOTE — Discharge Instructions (Signed)
Today's workup without any acute findings. Return for any new or worse symptoms. Referral information provided to the free clinic as well as the health department. Make arrangements to follow-up with one of these.

## 2016-02-20 NOTE — ED Provider Notes (Signed)
CSN: [id number]     Arrival date & time 02/20/16  2118 History  By signing my name below, I, Adrienne Crawford, attest that this documentation has been prepared under the direction and in the presence of physician practitioner, Vanetta MuldersScott Deepak Bless, MD. Electronically Signed: [name], Scribe. 02/20/2016. 10:37 PM.   Chief Complaint  Patient presents with  . Tremors    Patient is a 25 y.o. female presenting with syncope. The history is provided by the patient. No language interpreter was used.  Loss of Consciousness Episode history:  Single Most recent episode:  Yesterday Duration: unable to specify. Timing:  Rare Progression:  Improving Chronicity:  New Witnessed: unknown.   Relieved by:  Lying down Worsened by:  Nothing tried Ineffective treatments:  None tried Associated symptoms: chest pain (with "spells"), diaphoresis, dizziness, fever, headaches, nausea, shortness of breath and vomiting   Associated symptoms: no confusion   Chest pain:    Quality:  Unable to specify   Severity:  Moderate   Onset quality:  Sudden   Duration:  2 days   Timing:  Intermittent   Progression:  Unchanged   Chronicity:  New Fever:    Duration:  2 days   Timing:  Intermittent   Temp source:  Subjective   Progression:  Unable to specify Headaches:    Severity:  Moderate   Onset quality:  Sudden   Duration:  2 days   Timing:  Unable to specify   Progression:  Unchanged   Chronicity:  New Nausea:    Severity:  Moderate   Onset quality:  Sudden   Duration:  2 days   Timing:  Intermittent   Progression:  Unable to specify Shortness of breath:    Severity:  Moderate   Onset quality:  Sudden   Duration:  2 days   Timing:  Intermittent   Progression:  Unable to specify Vomiting:    Quality:  Unable to specify   Severity:  Unable to specify   Duration:  2 days   Timing:  Intermittent   Progression:  Unable to specify    HPI Comments: Adrienne Crawford is a 25 y.o. female with PMHx of  panic attack, depression, and UTI who presents to the Emergency Department with multiple complaints over the last two days. Pt reports tremors, lightheadedness, dizziness, "pins and needles" sensation in her legs, warmth in her legs, white spots on her legs, fever, chills, diaphoresis, nausea, vomiting, diarrhea, and syncope x1. Pt states that she feels "weird" and has never felt this way before. She reports that she felt like she was dying last night. She also notes back pain, bilateral ankle swelling, headache, chest pain, SOB, cough, rhinorrhea, dark/spotty vision, increased thirstiness, dysuria, and increased urinary frequency over the last several days. Pt was recently treated for a UTI and was prescribed a week's worth of Keflex; she has been off of Keflex for the last two days. She reports that she is on her menstrual cycle currently. She denies abdominal pain, sore throat, or any other associated symptoms.  Past Medical History  Diagnosis Date  . UTI (urinary tract infection)   . Asthma   . PID (acute pelvic inflammatory disease)   . Pregnant   . Anxiety   . Panic attack   . Depression   . Anemia 02/07/2013   Past Surgical History  Procedure Laterality Date  . Bladder surgery     Family History  Problem Relation Age of Onset  . Cancer Other  Lung, Throat annd Mouth  . Hypertension Paternal Grandfather   . Stroke Paternal Grandfather   . Cancer Maternal Grandmother   . Hypertension Maternal Grandmother   . Cancer Maternal Grandfather   . Hypertension Father   . Stroke Father   . Cancer Mother   . Hypertension Mother    Social History  Substance Use Topics  . Smoking status: Current Every Day Smoker -- 4.00 packs/day    Types: Cigarettes  . Smokeless tobacco: Never Used     Comment: Quit smoking with positive preg. test.  . Alcohol Use: Yes     Comment: occassional   OB History    Gravida Para Term Preterm AB TAB SAB Ectopic Multiple Living   3 2 2  1  1   2       Review of Systems  Constitutional: Positive for fever, chills and diaphoresis.  HENT: Positive for rhinorrhea. Negative for sore throat.   Eyes: Positive for visual disturbance (spott vision, darkened vision).  Respiratory: Positive for cough and shortness of breath.   Cardiovascular: Positive for chest pain (with "spells") and syncope.  Gastrointestinal: Positive for nausea, vomiting and diarrhea. Negative for abdominal pain.  Genitourinary: Positive for dysuria and frequency.  Musculoskeletal: Positive for back pain, joint swelling (bilateral ankles) and arthralgias (bilateral legs).  Skin: Positive for rash (right forearm, bilateral legs).  Neurological: Positive for dizziness, tremors, syncope (1 episode), light-headedness and headaches.  Hematological: Does not bruise/bleed easily.  Psychiatric/Behavioral: Negative for confusion.    Allergies  Sulfa antibiotics  Home Medications   Prior to Admission medications   Medication Sig Start Date End Date Taking? Authorizing Provider  acetaminophen (TYLENOL) 325 MG tablet Take 650 mg by mouth every 6 (six) hours as needed for pain.    Historical Provider, MD  ibuprofen (ADVIL,MOTRIN) 600 MG tablet Take 1 tablet (600 mg total) by mouth every 6 (six) hours as needed. 08/29/14   Burgess Amor, PA-C  metroNIDAZOLE (FLAGYL) 500 MG tablet Take 4 tablets (2,000 mg total) by mouth once. 04/29/14   Linwood Dibbles, MD  naproxen (NAPROSYN) 500 MG tablet Take 1 tablet (500 mg total) by mouth 2 (two) times daily. 04/29/14   Linwood Dibbles, MD   BP 132/68 mmHg  Pulse 88  Temp(Src) 98.6 F (37 C) (Oral)  Resp 17  Ht 5\' 6"  (1.676 m)  Wt 74.39 kg  BMI 26.48 kg/m2  SpO2 100%  LMP 02/20/2016 Physical Exam  Constitutional: She is oriented to person, place, and time. She appears well-developed and well-nourished. No distress.  HENT:  Head: Normocephalic and atraumatic.  Mouth/Throat: Oropharynx is clear and moist.  Eyes: Conjunctivae and EOM are normal. Pupils  are equal, round, and reactive to light. No scleral icterus.  Sclera are clear bilaterally. Eyes track normally.  Neck: Neck supple. No tracheal deviation present.  Cardiovascular: Normal rate, regular rhythm and normal heart sounds.   Pulmonary/Chest: Effort normal and breath sounds normal. No respiratory distress.  Abdominal: Bowel sounds are normal. There is no tenderness.  Abdomen is flat and non-tender.  Musculoskeletal: Normal range of motion. She exhibits no edema.  No pitting edema in the ankles.  Neurological: She is alert and oriented to person, place, and time. She has normal reflexes. No cranial nerve deficit. She exhibits normal muscle tone. Coordination normal.  Skin: Skin is warm and dry.  Psychiatric: She has a normal mood and affect. Her behavior is normal.  Nursing note and vitals reviewed.   ED Course  Procedures (including  critical care time)  DIAGNOSTIC STUDIES: Oxygen Saturation is 100% on RA, normal by my interpretation.    COORDINATION OF CARE: 10:37 PM Discussed treatment plan with pt at bedside and pt agreed to plan.  Results for orders placed or performed during the hospital encounter of 02/20/16  Basic metabolic panel  Result Value Ref Range   Sodium 137 135 - 145 mmol/L   Potassium 3.3 (L) 3.5 - 5.1 mmol/L   Chloride 103 101 - 111 mmol/L   CO2 26 22 - 32 mmol/L   Glucose, Bld 117 (H) 65 - 99 mg/dL   BUN 15 6 - 20 mg/dL   Creatinine, Ser 8.650.86 0.44 - 1.00 mg/dL   Calcium 8.6 (L) 8.9 - 10.3 mg/dL   GFR calc non Af Amer >60 >60 mL/min   GFR calc Af Amer >60 >60 mL/min   Anion gap 8 5 - 15  CBC with Differential  Result Value Ref Range   WBC 6.7 4.0 - 10.5 K/uL   RBC 4.35 3.87 - 5.11 MIL/uL   Hemoglobin 14.7 12.0 - 15.0 g/dL   HCT 78.442.8 69.636.0 - 29.546.0 %   MCV 98.4 78.0 - 100.0 fL   MCH 33.8 26.0 - 34.0 pg   MCHC 34.3 30.0 - 36.0 g/dL   RDW 28.412.9 13.211.5 - 44.015.5 %   Platelets 217 150 - 400 K/uL   Neutrophils Relative % 56 %   Neutro Abs 3.8 1.7 - 7.7  K/uL   Lymphocytes Relative 34 %   Lymphs Abs 2.3 0.7 - 4.0 K/uL   Monocytes Relative 7 %   Monocytes Absolute 0.5 0.1 - 1.0 K/uL   Eosinophils Relative 3 %   Eosinophils Absolute 0.2 0.0 - 0.7 K/uL   Basophils Relative 0 %   Basophils Absolute 0.0 0.0 - 0.1 K/uL  Urinalysis, Routine w reflex microscopic  Result Value Ref Range   Color, Urine YELLOW YELLOW   APPearance CLOUDY (A) CLEAR   Specific Gravity, Urine 1.020 1.005 - 1.030   pH 6.0 5.0 - 8.0   Glucose, UA NEGATIVE NEGATIVE mg/dL   Hgb urine dipstick LARGE (A) NEGATIVE   Bilirubin Urine SMALL (A) NEGATIVE   Ketones, ur TRACE (A) NEGATIVE mg/dL   Protein, ur 102100 (A) NEGATIVE mg/dL   Nitrite NEGATIVE NEGATIVE   Leukocytes, UA NEGATIVE NEGATIVE  Pregnancy, urine  Result Value Ref Range   Preg Test, Ur NEGATIVE NEGATIVE  Urine microscopic-add on  Result Value Ref Range   Squamous Epithelial / LPF TOO NUMEROUS TO COUNT (A) NONE SEEN   WBC, UA 6-30 0 - 5 WBC/hpf   RBC / HPF TOO NUMEROUS TO COUNT 0 - 5 RBC/hpf   Bacteria, UA FEW (A) NONE SEEN   Urine-Other MUCOUS PRESENT    No results found.  Medications - No data to display  MDM   Final diagnoses:  Syncope, unspecified syncope type  Anxiety    Patient with a multitude of symptoms that all could be anxiety related. Today's workup without any significant findings from CT test negative. Urinalysis with lots of blood in the urine but she is on her menses. No leukocytosis no significant anemia. No significant electrolyte abnormalities. Mild hypokalemia. Also mild hypocalcemia. Patient does not have a primary care doctor. Will be given referral to health department in the [facility].  Patient here is been stable. Patient did relate that during one of these episodes of she had syncopal episode. That has not been recurrent. Patiently recently finished a course  of antibiotics Keflex for urinary tract infection. She been off of them for several days. Urine.does not seem to be  consistent with urinary tract infection here today but urine culture sent. Patient's vital signs are stable she's stable for discharge home and follow-up.   I personally performed the services described in this documentation, which was scribed in my presence. The recorded information has been reviewed and is accurate.     Vanetta Mulders, MD 02/20/16 [phone]

## 2016-02-20 NOTE — ED Notes (Signed)
Patient states "jittery feeling" "burning inside" "dizzy" "white patches on legs" and patient states "i just dont feel right" patient has recently been treated for UTI

## 2016-02-22 LAB — URINE CULTURE

## 2016-04-09 ENCOUNTER — Encounter (HOSPITAL_COMMUNITY): Payer: Self-pay

## 2016-04-09 DIAGNOSIS — N390 Urinary tract infection, site not specified: Secondary | ICD-10-CM | POA: Insufficient documentation

## 2016-04-09 DIAGNOSIS — J45909 Unspecified asthma, uncomplicated: Secondary | ICD-10-CM | POA: Insufficient documentation

## 2016-04-09 DIAGNOSIS — F17210 Nicotine dependence, cigarettes, uncomplicated: Secondary | ICD-10-CM | POA: Insufficient documentation

## 2016-04-09 DIAGNOSIS — R1030 Lower abdominal pain, unspecified: Secondary | ICD-10-CM | POA: Diagnosis present

## 2016-04-09 NOTE — ED Triage Notes (Signed)
Lower abdominal pain, burning with urination that started yesterday per pt. Patient anxious at triage.  Increase in urgency, but not urinating a lot.

## 2016-04-10 ENCOUNTER — Encounter (HOSPITAL_COMMUNITY): Payer: Self-pay | Admitting: Emergency Medicine

## 2016-04-10 ENCOUNTER — Emergency Department (HOSPITAL_COMMUNITY): Payer: Medicaid Other

## 2016-04-10 ENCOUNTER — Emergency Department (HOSPITAL_COMMUNITY)
Admission: EM | Admit: 2016-04-10 | Discharge: 2016-04-10 | Disposition: A | Discharge status: Home / Self Care | Payer: Medicaid Other | Attending: Emergency Medicine | Admitting: Emergency Medicine

## 2016-04-10 DIAGNOSIS — N390 Urinary tract infection, site not specified: Secondary | ICD-10-CM

## 2016-04-10 DIAGNOSIS — R319 Hematuria, unspecified: Secondary | ICD-10-CM

## 2016-04-10 LAB — URINALYSIS, ROUTINE W REFLEX MICROSCOPIC
GLUCOSE, UA: NEGATIVE mg/dL
KETONES UR: NEGATIVE mg/dL
Leukocytes, UA: NEGATIVE
Nitrite: POSITIVE — AB
Protein, ur: >300 mg/dL — AB
Specific Gravity, Urine: >1.03 — ABNORMAL HIGH (ref 1.005–1.030)
pH: 6.5 (ref 5.0–8.0)

## 2016-04-10 LAB — URINE MICROSCOPIC-ADD ON

## 2016-04-10 LAB — POC URINE PREG, ED: Preg Test, Ur: NEGATIVE

## 2016-04-10 MED ORDER — NITROFURANTOIN MONOHYD MACRO 100 MG PO CAPS
ORAL_CAPSULE | ORAL | Status: AC
  Administered 2016-04-10: 100 mg
  Filled 2016-04-10: qty 1

## 2016-04-10 MED ORDER — PHENAZOPYRIDINE HCL 200 MG PO TABS: 200.0000 mg | ORAL_TABLET | Freq: Three times a day (TID) | ORAL | 0 refills | Status: DC

## 2016-04-10 MED ORDER — OXYCODONE-ACETAMINOPHEN 5-325 MG PO TABS
1.0000 | ORAL_TABLET | Freq: Once | ORAL | Status: AC
  Administered 2016-04-10: 1 via ORAL
  Filled 2016-04-10: qty 1

## 2016-04-10 MED ORDER — NITROFURANTOIN MACROCRYSTAL 100 MG PO CAPS
100.0000 mg | ORAL_CAPSULE | Freq: Once | ORAL | Status: AC
  Administered 2016-04-10: 100 mg via ORAL
  Filled 2016-04-10: qty 1

## 2016-04-10 MED ORDER — OXYCODONE-ACETAMINOPHEN 5-325 MG PO TABS: 1.0000 | ORAL_TABLET | ORAL | 0 refills | Status: DC | PRN

## 2016-04-10 MED ORDER — PHENAZOPYRIDINE HCL 100 MG PO TABS
200.0000 mg | ORAL_TABLET | Freq: Once | ORAL | Status: AC
  Administered 2016-04-10: 200 mg via ORAL
  Filled 2016-04-10: qty 2

## 2016-04-10 MED ORDER — NITROFURANTOIN MONOHYD MACRO 100 MG PO CAPS: 100.0000 mg | ORAL_CAPSULE | Freq: Two times a day (BID) | ORAL | 0 refills | Status: DC

## 2016-04-10 NOTE — ED Provider Notes (Signed)
[facility]EMER[facility]ENCY DEPT Provider Note   CSN: [id number] Arrival date & time: 04/09/16  2254 By signing my name below, I, [name], attest that this documentation has been prepared under the direction and in the presence of [name] Amybeth Sieg, MD. Electronically Signed: [name], ED Scribe. 04/10/16. 12:16 AM.   History   Chief Complaint Chief Complaint  Patient presents with  . Abdominal Pain   HPI Comments: Adrienne Crawford is a 25 y.o. female who presents to the Emergency Department complaining of sudden onset, constant, 10/10 lower abdominal pain onset one day ago. Pt also has associated urinary urgency, frequency, diaphoresis, vaginal bleeding, dysuria and constant burning in the vaginal area. Pt is on birth control. She states she has had unprotected sexual intercourse with her ex-fiance. Pt is currently anxious on exam. Pt does not have a PCP. Denies fever, vaginal discharge, or any other associated symptoms.   The history is provided by the patient. No language interpreter was used.    Past Medical History:  Diagnosis Date  . Anemia 02/07/2013  . Anxiety   . Asthma   . Depression   . Panic attack   . PID (acute pelvic inflammatory disease)   . Pregnant   . UTI (urinary tract infection)     Patient Active Problem List   Diagnosis Date Noted  . Anemia 02/07/2013  . Depression 02/07/2013    Past Surgical History:  Procedure Laterality Date  . BLADDER SURGERY      OB History    Gravida Para Term Preterm AB Living   3 2 2   1 2    SAB TAB Ectopic Multiple Live Births   1       2       Home Medications    Prior to Admission medications   Medication Sig Start Date End Date Taking? Authorizing Provider  acetaminophen (TYLENOL) 325 MG tablet Take 650 mg by mouth every 6 (six) hours as needed for pain.    Historical Provider, MD  ibuprofen (ADVIL,MOTRIN) 600 MG tablet Take 1 tablet (600 mg total) by mouth every 6 (six) hours as needed. 08/29/14   Burgess AmorJulie Idol, PA-C    metroNIDAZOLE (FLAGYL) 500 MG tablet Take 4 tablets (2,000 mg total) by mouth once. 04/29/14   Linwood DibblesJon Knapp, MD  naproxen (NAPROSYN) 500 MG tablet Take 1 tablet (500 mg total) by mouth 2 (two) times daily. 04/29/14   Linwood DibblesJon Knapp, MD    Family History Family History  Problem Relation Age of Onset  . Hypertension Paternal Grandfather   . Stroke Paternal Grandfather   . Cancer Maternal Grandmother   . Hypertension Maternal Grandmother   . Cancer Maternal Grandfather   . Hypertension Father   . Stroke Father   . Cancer Mother   . Hypertension Mother   . Cancer Other     Lung, Throat annd Mouth    Social History Social History  Substance Use Topics  . Smoking status: Current Every Day Smoker    Packs/day: 4.00    Types: Cigarettes  . Smokeless tobacco: Never Used     Comment: Quit smoking with positive preg. test.  . Alcohol use Yes     Comment: occassional     Allergies   Sulfa antibiotics   Review of Systems Review of Systems  Constitutional: Positive for diaphoresis. Negative for fever.  Gastrointestinal: Positive for abdominal pain.  Genitourinary: Positive for dysuria, frequency, urgency and vaginal bleeding. Negative for vaginal discharge.  All other systems reviewed and  are negative.    Physical Exam Updated Vital Signs BP 136/80 (BP Location: Left Arm)   Pulse 82   Temp 98.2 F (36.8 C) (Oral)   Resp 17   SpO2 100%   Physical Exam  Constitutional: She is oriented to person, place, and time. She appears well-developed and well-nourished.  Appears uncomfortable.  HENT:  Head: Normocephalic and atraumatic.  Eyes: EOM are normal. Pupils are equal, round, and reactive to light.  Neck: Normal range of motion. Neck supple. No JVD present.  Cardiovascular: Normal rate, regular rhythm and normal heart sounds.   No murmur heard. Pulmonary/Chest: Effort normal and breath sounds normal. She has no wheezes. She has no rales. She exhibits no tenderness.  Abdominal:  Soft. Bowel sounds are normal. She exhibits no distension and no mass. There is tenderness. There is no rebound and no guarding.  Mild suprapubic tenderness. No CVA tenderness.  Musculoskeletal: Normal range of motion. She exhibits no edema.  Lymphadenopathy:    She has no cervical adenopathy.  Neurological: She is alert and oriented to person, place, and time. No cranial nerve deficit. She exhibits normal muscle tone. Coordination normal.  Skin: Skin is warm and dry. No rash noted.  Psychiatric: She has a normal mood and affect. Her behavior is normal. Judgment and thought content normal.  Nursing note and vitals reviewed.  ED Treatments / Results  DIAGNOSTIC STUDIES: Oxygen Saturation is 100% on RA, normal by my interpretation.    COORDINATION OF CARE: 12:14 AM Discussed treatment plan with pt at bedside which includes urinalysis and pt agreed to plan.  Labs (all labs ordered are listed, but only abnormal results are displayed) Labs Reviewed  URINALYSIS, ROUTINE W REFLEX MICROSCOPIC (NOT AT [facility]) - Abnormal; Notable for the following:       Result Value   Color, Urine AMBER (*)    APPearance CLOUDY (*)    Specific Gravity, Urine >1.030 (*)    Hgb urine dipstick LARGE (*)    Bilirubin Urine SMALL (*)    Protein, ur >300 (*)    Nitrite POSITIVE (*)    All other components within normal limits  URINE MICROSCOPIC-ADD ON - Abnormal; Notable for the following:    Squamous Epithelial / LPF 0-5 (*)    Bacteria, UA FEW (*)    All other components within normal limits  POC URINE PREG, ED   Radiology Ct Renal Stone Study  Result Date: 04/10/2016 CLINICAL DATA:  Abdominal pain.  Urinary urgency and frequency. EXAM: CT ABDOMEN AND PELVIS WITHOUT CONTRAST TECHNIQUE: Multidetector CT imaging of the abdomen and pelvis was performed following the standard protocol without IV contrast. COMPARISON:  Pelvic ultrasound 04/29/2014 FINDINGS: Lower chest:  No pulmonary nodules or pleural effusion.  Hepatobiliary: Normal noncontrast appearance of the liver. No biliary dilatation. Gallbladder is normal. Pancreas: Pancreatic contours are normal. No abnormal calcifications. No peripancreatic fluid collection. No pancreatic ductal dilatation. Spleen: Normal noncontrast appearance the spleen. Adrenals/Urinary Tract: The adrenal glands are normal. There is no hydronephrosis. No nephrolithiasis. The ureters are normal in course and caliber without evidence of obstructing stone. No perinephric stranding. Stomach/Bowel: No dilated small bowel or other evidence of obstruction. No enteric or colonic inflammation. Normal appendix. Vascular/Lymphatic: Normal unenhanced appearance of the abdominal and pelvic vessels. No abdominal or pelvic lymphadenopathy. Reproductive: The uterus is normal. Left ovarian cyst measures 2.1 x 2.4 cm, likely physiologic. Right ovary is normal. No free fluid in the pelvis. Other: Normal extraperitoneal and visualized extra thoracic soft tissues.  Musculoskeletal: Normal IMPRESSION: 1. No obstructive uropathy. 2. No nephrolithiasis. 3. No acute intra-abdominal process. Electronically Signed   By: Deatra RobinsonKevin  Herman M.D.   On: 04/10/2016 03:10    Procedures Procedures (including critical care time)  Medications Ordered in ED Medications  nitrofurantoin (MACRODANTIN) capsule 100 mg (not administered)  oxyCODONE-acetaminophen (PERCOCET/ROXICET) 5-325 MG per tablet 1 tablet (1 tablet Oral Given 04/10/16 0232)  phenazopyridine (PYRIDIUM) tablet 200 mg (200 mg Oral Given 04/10/16 0406)  nitrofurantoin (macrocrystal-monohydrate) (MACROBID) 100 MG capsule (100 mg  Given 04/10/16 0406)     Initial Impression / Assessment and Plan / ED Course  I have reviewed the triage vital signs and the nursing notes.  Pertinent labs & imaging results that were available during my care of the patient were reviewed by me and considered in my medical decision making (see chart for details).  Clinical Course    Dysuria strongly suggestive of urinary tract infection. She is given a dose of oxycodone-acetaminophen for pain. Urinalysis has come back with positive nitrite but possibility that it's secondary to color interference, even though the color was listed as Amber which normally would not be something that should cause interference with the dipstick. RBCs are present, but she is currently menstruating. Only 0-5 WBCs and few bacteria are not typical of infection. She is sent for CT renal stone protocol showing no acute pathology. Decision is made to treat her for urinary tract infection. She is given dose of nitrofurantoin and phenazopyridine. She is sent home with prescriptions for nitrofurantoin, phenazopyridine, and oxycodone-acetaminophen..  Final Clinical Impressions(s) / ED Diagnoses   Final diagnoses:  Urinary tract infection with hematuria, site unspecified    New Prescriptions New Prescriptions   NITROFURANTOIN, MACROCRYSTAL-MONOHYDRATE, (MACROBID) 100 MG CAPSULE    Take 1 capsule (100 mg total) by mouth 2 (two) times daily. X 7 days   OXYCODONE-ACETAMINOPHEN (PERCOCET/ROXICET) 5-325 MG TABLET    Take 1 tablet by mouth every 4 (four) hours as needed.   PHENAZOPYRIDINE (PYRIDIUM) 200 MG TABLET    Take 1 tablet (200 mg total) by mouth 3 (three) times daily.  I personally performed the services described in this documentation, which was scribed in my presence. The recorded information has been reviewed and is accurate.       [name] Kaesyn Johnston, MD 04/10/16 [phone]

## 2016-04-10 NOTE — ED Notes (Signed)
Repeat UA specimen collected- Due to scant amount of urine, spec to lab with request for lab to call for any left over for POC pregnancy

## 2016-04-10 NOTE — ED Notes (Signed)
Pt awakened for VS

## 2016-04-10 NOTE — ED Notes (Signed)
Pt angry that she has to resubmit- states that she cannot go at this time

## 2016-04-10 NOTE — ED Notes (Signed)
From CT 

## 2016-04-10 NOTE — ED Notes (Signed)
Patient transported to CT via wheelchair

## 2016-04-10 NOTE — ED Notes (Signed)
Lab called re: dirty urine- request of pt to resubmit

## 2016-11-04 ENCOUNTER — Emergency Department (HOSPITAL_COMMUNITY): Payer: Medicaid Other

## 2016-11-04 ENCOUNTER — Encounter (HOSPITAL_COMMUNITY): Payer: Self-pay

## 2016-11-04 ENCOUNTER — Emergency Department (HOSPITAL_COMMUNITY)
Admission: EM | Admit: 2016-11-04 | Discharge: 2016-11-04 | Disposition: A | Discharge status: Home / Self Care | Payer: Medicaid Other | Attending: Emergency Medicine | Admitting: Emergency Medicine

## 2016-11-04 ENCOUNTER — Other Ambulatory Visit: Payer: Self-pay

## 2016-11-04 DIAGNOSIS — R109 Unspecified abdominal pain: Secondary | ICD-10-CM | POA: Insufficient documentation

## 2016-11-04 DIAGNOSIS — F17210 Nicotine dependence, cigarettes, uncomplicated: Secondary | ICD-10-CM | POA: Diagnosis not present

## 2016-11-04 DIAGNOSIS — R531 Weakness: Secondary | ICD-10-CM | POA: Insufficient documentation

## 2016-11-04 DIAGNOSIS — J45909 Unspecified asthma, uncomplicated: Secondary | ICD-10-CM | POA: Diagnosis not present

## 2016-11-04 LAB — BASIC METABOLIC PANEL
Anion gap: 8 (ref 5–15)
BUN: 10 mg/dL (ref 6–20)
CALCIUM: 9 mg/dL (ref 8.9–10.3)
CHLORIDE: 106 mmol/L (ref 101–111)
CO2: 23 mmol/L (ref 22–32)
CREATININE: 0.71 mg/dL (ref 0.44–1.00)
Glucose, Bld: 81 mg/dL (ref 65–99)
Potassium: 3.5 mmol/L (ref 3.5–5.1)
SODIUM: 137 mmol/L (ref 135–145)

## 2016-11-04 LAB — CBC
HCT: 40.1 % (ref 36.0–46.0)
HEMOGLOBIN: 14.3 g/dL (ref 12.0–15.0)
MCH: 34.3 pg — ABNORMAL HIGH (ref 26.0–34.0)
MCHC: 35.7 g/dL (ref 30.0–36.0)
MCV: 96.2 fL (ref 78.0–100.0)
PLATELETS: 143 10*3/uL — AB (ref 150–400)
RBC: 4.17 MIL/uL (ref 3.87–5.11)
RDW: 13.7 % (ref 11.5–15.5)
WBC: 7.5 10*3/uL (ref 4.0–10.5)

## 2016-11-04 LAB — I-STAT BETA HCG BLOOD, ED (MC, WL, AP ONLY)

## 2016-11-04 LAB — URINALYSIS, ROUTINE W REFLEX MICROSCOPIC
BILIRUBIN URINE: NEGATIVE
GLUCOSE, UA: NEGATIVE mg/dL
Hgb urine dipstick: NEGATIVE
KETONES UR: NEGATIVE mg/dL
LEUKOCYTES UA: NEGATIVE
NITRITE: NEGATIVE
PROTEIN: NEGATIVE mg/dL
Specific Gravity, Urine: 1.008 (ref 1.005–1.030)
pH: 6 (ref 5.0–8.0)

## 2016-11-04 MED ORDER — TRAMADOL HCL 50 MG PO TABS: 50.0000 mg | ORAL_TABLET | Freq: Four times a day (QID) | ORAL | 0 refills | Status: DC | PRN

## 2016-11-04 MED ORDER — KETOROLAC TROMETHAMINE 30 MG/ML IJ SOLN
15.0000 mg | Freq: Once | INTRAMUSCULAR | Status: DC
  Filled 2016-11-04: qty 1

## 2016-11-04 MED ORDER — KETOROLAC TROMETHAMINE 30 MG/ML IJ SOLN
15.0000 mg | Freq: Once | INTRAMUSCULAR | Status: AC
  Administered 2016-11-04: 15 mg via INTRAVENOUS

## 2016-11-04 NOTE — Discharge Instructions (Signed)
As discussed, your evaluation today has been largely reassuring.  But, it is important that you monitor your condition carefully, and do not hesitate to return to the ED if you develop new, or concerning changes in your condition. ? ?Otherwise, please follow-up with your physician for appropriate ongoing care. ? ?

## 2016-11-04 NOTE — ED Provider Notes (Signed)
[facility]-EMERGENCY DEPT Provider Note   CSN: [id number] Arrival date & time: 11/04/16  1914     History   Chief Complaint Chief Complaint  Patient presents with  . Flank Pain    HPI Adrienne Crawford is a 26 y.o. female.  HPI Patient presents with concern of flank pain, bilateral, as well as abdominal pain, fatigue, nausea, weakness. Symptoms of been present for some time, possibly worse over the past 2 days, with new diaphoresis. Patient acknowledges multiple medical issues including anxiety, asthma, frequent urinary tract infection, as well as dysmenorrhea She last saw her gynecologist about one month ago. She has been trying multiple medication for control of her menstrual cycle irregularity. She currently does have vaginal bleeding, no discharge, no pain.   Past Medical History:  Diagnosis Date  . Anemia 02/07/2013  . Anxiety   . Asthma   . Depression   . Panic attack   . PID (acute pelvic inflammatory disease)   . Pregnant   . UTI (urinary tract infection)     Patient Active Problem List   Diagnosis Date Noted  . Anemia 02/07/2013  . Depression 02/07/2013    Past Surgical History:  Procedure Laterality Date  . BLADDER SURGERY      OB History    Gravida Para Term Preterm AB Living   3 2 2   1 2    SAB TAB Ectopic Multiple Live Births   1       2       Home Medications    Prior to Admission medications   Medication Sig Start Date End Date Taking? Authorizing Provider  acetaminophen (TYLENOL) 325 MG tablet Take 650 mg by mouth every 6 (six) hours as needed for pain.    Historical Provider, MD  ibuprofen (ADVIL,MOTRIN) 600 MG tablet Take 1 tablet (600 mg total) by mouth every 6 (six) hours as needed. 08/29/14   Burgess AmorJulie Idol, PA-C  metroNIDAZOLE (FLAGYL) 500 MG tablet Take 4 tablets (2,000 mg total) by mouth once. 04/29/14   Linwood DibblesJon Knapp, MD  naproxen (NAPROSYN) 500 MG tablet Take 1 tablet (500 mg total) by mouth 2 (two) times daily. 04/29/14   Linwood DibblesJon Knapp, MD    nitrofurantoin, macrocrystal-monohydrate, (MACROBID) 100 MG capsule Take 1 capsule (100 mg total) by mouth 2 (two) times daily. X 7 days 04/10/16   [name] Glick, MD  oxyCODONE-acetaminophen (PERCOCET/ROXICET) 5-325 MG tablet Take 1 tablet by mouth every 4 (four) hours as needed. 04/10/16   [name] Glick, MD  phenazopyridine (PYRIDIUM) 200 MG tablet Take 1 tablet (200 mg total) by mouth 3 (three) times daily. 04/10/16   [name] Glick, MD    Family History Family History  Problem Relation Age of Onset  . Hypertension Paternal Grandfather   . Stroke Paternal Grandfather   . Cancer Maternal Grandmother   . Hypertension Maternal Grandmother   . Cancer Maternal Grandfather   . Hypertension Father   . Stroke Father   . Cancer Mother   . Hypertension Mother   . Cancer Other     Lung, Throat annd Mouth    Social History Social History  Substance Use Topics  . Smoking status: Current Every Day Smoker    Packs/day: 4.00    Types: Cigarettes  . Smokeless tobacco: Never Used     Comment: Quit smoking with positive preg. test.  . Alcohol use Yes     Comment: occassional     Allergies   Sulfa antibiotics   Review of Systems Review  of Systems  Constitutional:       Per HPI, otherwise negative  HENT:       Per HPI, otherwise negative  Respiratory:       Per HPI, otherwise negative  Cardiovascular:       Per HPI, otherwise negative  Gastrointestinal: Negative for vomiting.  Endocrine:       Negative aside from HPI  Genitourinary:       Neg aside from HPI   Musculoskeletal:       Per HPI, otherwise negative  Skin: Negative.   Neurological: Positive for weakness. Negative for syncope.  Psychiatric/Behavioral: Positive for dysphoric mood. The patient is nervous/anxious.      Physical Exam Updated Vital Signs BP (!) 130/92 (BP Location: Right Arm)   Pulse (!) 104   Temp 99.2 F (37.3 C) (Oral)   Resp 20   Ht 5\' 6"  (1.676 m)   Wt 165 lb (74.8 kg)   SpO2 100%   BMI 26.63  kg/m   Physical Exam  Constitutional: She is oriented to person, place, and time. She appears well-developed and well-nourished. No distress.  HENT:  Head: Normocephalic and atraumatic.  Eyes: Conjunctivae and EOM are normal.  Cardiovascular: Normal rate and regular rhythm.   Pulmonary/Chest: Effort normal and breath sounds normal. No stridor. No respiratory distress.  Abdominal: She exhibits no distension.  Minimal tenderness anywhere, mild discomfort with left lateral abdominal palpation, and left CVA.   Musculoskeletal: She exhibits no edema.  Neurological: She is alert and oriented to person, place, and time. No cranial nerve deficit.  Skin: Skin is warm and dry.  Psychiatric: She has a normal mood and affect.  Nursing note and vitals reviewed.    ED Treatments / Results  Labs (all labs ordered are listed, but only abnormal results are displayed) Labs Reviewed  URINALYSIS, ROUTINE W REFLEX MICROSCOPIC - Abnormal; Notable for the following:       Result Value   Color, Urine STRAW (*)    All other components within normal limits  CBC - Abnormal; Notable for the following:    MCH 34.3 (*)    Platelets 143 (*)    All other components within normal limits  BASIC METABOLIC PANEL  I-STAT BETA HCG BLOOD, ED ([facility], [facility], [facility] ONLY)   Radiology Ct Renal Stone Study  Result Date: 11/04/2016 CLINICAL DATA:  Initial evaluation for acute abdominal pain, nausea, diarrhea. EXAM: CT ABDOMEN AND PELVIS WITHOUT CONTRAST TECHNIQUE: Multidetector CT imaging of the abdomen and pelvis was performed following the standard protocol without IV contrast. COMPARISON:  Prior CT from 04/10/2016. FINDINGS: Lower chest: Minimal hazy atelectatic changes present within the lung bases. Visualized lungs are otherwise clear. Hepatobiliary: Liver demonstrates a normal unenhanced appearance. Gallbladder normal. No biliary dilatation. Pancreas: Pancreas within normal limits. Spleen: Spleen within normal limits.  Adrenals/Urinary Tract: Adrenal glands are normal. Kidneys fairly equal in size. 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 No nephrolithiasis. Tiny punctate parenchymal calcification noted within the lower left kidney. No radiopaque calculi seen along the course of either renal collecting system. No hydroureter. Bladder within normal limits. Stomach/Bowel: Small hiatal hernia. Stomach otherwise unremarkable. No evidence for bowel obstruction. Appendix within normal limits. No acute inflammatory changes seen about the bowels. Vascular/Lymphatic: Intra- abdominal aorta of normal caliber. No pathologically enlarged intra-abdominal or pelvic lymph nodes. Reproductive: Uterus and ovaries within normal limits. Other: No free air or fluid. Tiny fat containing paraumbilical hernia noted. Musculoskeletal: No acute osseous abnormality. No worrisome  lytic or blastic osseous lesions. IMPRESSION: 1. No CT evidence for nephrolithiasis or obstructive uropathy. No ureterolithiasis. 2. No other acute intra-abdominal or pelvic process identified. Electronically Signed   By: Rise Mu M.D.   On: 11/04/2016 21:54    Procedures Procedures (including critical care time)  Medications Ordered in ED Medications  ketorolac (TORADOL) 30 MG/ML injection 15 mg (15 mg Intravenous Given 11/04/16 2124)     Initial Impression / Assessment and Plan / ED Course  I have reviewed the triage vital signs and the nursing notes.  Pertinent labs & imaging results that were available during my care of the patient were reviewed by me and considered in my medical decision making (see chart for details).  Yes female presents with multiple complaints, now seemingly has worsening flank and abdominal pain. Here she is awake and alert, hemodynamically stable. Though she describes flank pain, abdominal pain, chills, there is no evidence for infection, nor urinary tract obstruction, no evidence for hepatobiliary  dysfunction. No evidence for pneumonia on clinical exam. Patient improved here with Toradol. Patient discharged in stable condition with outpatient follow-up.   Final Clinical Impressions(s) / ED Diagnoses  Abdominal pain   Gerhard Munch, MD 11/04/16 2206

## 2016-11-04 NOTE — ED Notes (Signed)
PT states understanding of care given, follow up care, and medication prescribed, pain management. PT is ambulated from ED to car with a steady gait.

## 2016-11-04 NOTE — ED Notes (Signed)
Patient complaining of chest pain at this time in triage.  Will do an EKG.

## 2016-11-04 NOTE — ED Notes (Signed)
Pt back in room.

## 2016-11-04 NOTE — ED Triage Notes (Signed)
I am having a lot of abdominal pain, nauseated, hurts to breath, having nausea, vomiting, and diarrhea.  Having cold chills, sweating at times.  Feel really awful like I am going to pass out at times.  Feel really weak, tired, and unable to stand up.  Started feeling bad 4 days ago.  I have also been bleeding really heavy for the past 3 weeks.  Have been to see my OB for the bleeding.  Have knots coming up on my neck.  Got really scared today so I came here.

## 2016-11-08 ENCOUNTER — Encounter (HOSPITAL_COMMUNITY): Payer: Self-pay | Admitting: Emergency Medicine

## 2016-11-08 ENCOUNTER — Emergency Department (HOSPITAL_COMMUNITY)
Admission: EM | Admit: 2016-11-08 | Discharge: 2016-11-09 | Discharge status: Against Medical Advice | Payer: Medicaid Other | Attending: Emergency Medicine | Admitting: Emergency Medicine

## 2016-11-08 DIAGNOSIS — F10920 Alcohol use, unspecified with intoxication, uncomplicated: Secondary | ICD-10-CM

## 2016-11-08 DIAGNOSIS — R930 Abnormal findings on diagnostic imaging of skull and head, not elsewhere classified: Secondary | ICD-10-CM | POA: Insufficient documentation

## 2016-11-08 DIAGNOSIS — F17210 Nicotine dependence, cigarettes, uncomplicated: Secondary | ICD-10-CM | POA: Diagnosis not present

## 2016-11-08 DIAGNOSIS — Y999 Unspecified external cause status: Secondary | ICD-10-CM | POA: Diagnosis not present

## 2016-11-08 DIAGNOSIS — F10120 Alcohol abuse with intoxication, uncomplicated: Secondary | ICD-10-CM | POA: Diagnosis not present

## 2016-11-08 DIAGNOSIS — S0993XA Unspecified injury of face, initial encounter: Secondary | ICD-10-CM | POA: Diagnosis present

## 2016-11-08 DIAGNOSIS — J45909 Unspecified asthma, uncomplicated: Secondary | ICD-10-CM | POA: Insufficient documentation

## 2016-11-08 DIAGNOSIS — Z79899 Other long term (current) drug therapy: Secondary | ICD-10-CM | POA: Diagnosis not present

## 2016-11-08 DIAGNOSIS — S01512A Laceration without foreign body of oral cavity, initial encounter: Secondary | ICD-10-CM | POA: Diagnosis not present

## 2016-11-08 DIAGNOSIS — Y92410 Unspecified street and highway as the place of occurrence of the external cause: Secondary | ICD-10-CM | POA: Diagnosis not present

## 2016-11-08 DIAGNOSIS — Y939 Activity, unspecified: Secondary | ICD-10-CM | POA: Diagnosis not present
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
## 2016-11-09 ENCOUNTER — Emergency Department (HOSPITAL_COMMUNITY): Payer: Medicaid Other

## 2016-11-09 LAB — ETHANOL: ALCOHOL ETHYL (B): 267 mg/dL — AB (ref <5)

## 2016-11-09 LAB — RAPID URINE DRUG SCREEN, HOSP PERFORMED
Amphetamines: NOT DETECTED
Barbiturates: NOT DETECTED
Benzodiazepines: NOT DETECTED
Cocaine: POSITIVE — AB
OPIATES: NOT DETECTED
Tetrahydrocannabinol: POSITIVE — AB

## 2016-11-09 LAB — PREGNANCY, URINE: PREG TEST UR: NEGATIVE

## 2016-11-09 NOTE — ED Notes (Signed)
Delay in lab draw,  Pt in xray. 

## 2016-11-09 NOTE — ED Notes (Signed)
Pt becoming increasingly agitated and wandering around the unit. EDP aware. Security present. Pt's family present, pt ambulatory without difficulty, airway intact. Belongings given to family.

## 2017-01-10 ENCOUNTER — Emergency Department (HOSPITAL_COMMUNITY)
Admission: EM | Admit: 2017-01-10 | Discharge: 2017-01-11 | Disposition: A | Discharge status: Home / Self Care | Payer: Medicaid Other | Attending: Emergency Medicine | Admitting: Emergency Medicine

## 2017-01-10 DIAGNOSIS — F17210 Nicotine dependence, cigarettes, uncomplicated: Secondary | ICD-10-CM | POA: Diagnosis not present

## 2017-01-10 DIAGNOSIS — J45909 Unspecified asthma, uncomplicated: Secondary | ICD-10-CM | POA: Diagnosis not present

## 2017-01-10 DIAGNOSIS — S0502XA Injury of conjunctiva and corneal abrasion without foreign body, left eye, initial encounter: Secondary | ICD-10-CM | POA: Insufficient documentation

## 2017-01-10 DIAGNOSIS — Y939 Activity, unspecified: Secondary | ICD-10-CM | POA: Insufficient documentation

## 2017-01-10 DIAGNOSIS — Y999 Unspecified external cause status: Secondary | ICD-10-CM | POA: Insufficient documentation

## 2017-01-10 DIAGNOSIS — X58XXXA Exposure to other specified factors, initial encounter: Secondary | ICD-10-CM | POA: Insufficient documentation

## 2017-01-10 DIAGNOSIS — F1290 Cannabis use, unspecified, uncomplicated: Secondary | ICD-10-CM | POA: Diagnosis not present

## 2017-01-10 DIAGNOSIS — Z79899 Other long term (current) drug therapy: Secondary | ICD-10-CM | POA: Diagnosis not present

## 2017-01-10 DIAGNOSIS — Y929 Unspecified place or not applicable: Secondary | ICD-10-CM | POA: Insufficient documentation

## 2017-01-10 DIAGNOSIS — F1490 Cocaine use, unspecified, uncomplicated: Secondary | ICD-10-CM | POA: Insufficient documentation

## 2017-01-11 ENCOUNTER — Encounter (HOSPITAL_COMMUNITY): Payer: Self-pay | Admitting: *Deleted

## 2017-01-11 MED ORDER — TETRACAINE HCL 0.5 % OP SOLN
1.0000 [drp] | Freq: Once | OPHTHALMIC | Status: AC
  Administered 2017-01-11: 1 [drp] via OPHTHALMIC

## 2017-01-11 MED ORDER — CIPROFLOXACIN HCL 0.3 % OP SOLN
OPHTHALMIC | Status: AC
  Filled 2017-01-11: qty 2.5

## 2017-01-11 MED ORDER — OXYCODONE-ACETAMINOPHEN 5-325 MG PO TABS
1.0000 | ORAL_TABLET | Freq: Once | ORAL | Status: AC
  Administered 2017-01-11: 1 via ORAL
  Filled 2017-01-11: qty 1

## 2017-01-11 MED ORDER — TETRACAINE HCL 0.5 % OP SOLN
OPHTHALMIC | Status: AC
  Filled 2017-01-11: qty 4

## 2017-01-11 MED ORDER — PROPARACAINE HCL 0.5 % OP SOLN
1.0000 [drp] | Freq: Once | OPHTHALMIC | Status: DC
  Filled 2017-01-11: qty 15

## 2017-01-11 MED ORDER — FLUORESCEIN SODIUM 0.6 MG OP STRP
1.0000 | ORAL_STRIP | Freq: Once | OPHTHALMIC | Status: AC
  Administered 2017-01-11: 1 via OPHTHALMIC
  Filled 2017-01-11: qty 1

## 2017-01-11 MED ORDER — IBUPROFEN 400 MG PO TABS
600.0000 mg | ORAL_TABLET | Freq: Once | ORAL | Status: AC
  Administered 2017-01-11: 600 mg via ORAL
  Filled 2017-01-11: qty 2

## 2017-01-11 MED ORDER — CIPROFLOXACIN HCL 0.3 % OP SOLN
2.0000 [drp] | OPHTHALMIC | Status: DC
  Administered 2017-01-11: 2 [drp] via OPHTHALMIC
  Filled 2017-01-11: qty 2.5

## 2017-01-11 NOTE — ED Notes (Signed)
Adrienne Crawford, [facility] notified [facility] need for proparacaine.

## 2017-01-11 NOTE — ED Provider Notes (Signed)
[facility]-EMERGENCY DEPT Provider Note   CSN: [id number] Arrival date & time: 01/10/17  2352 By signing my name below, I, Levon Hedger, attest that this documentation has been prepared under the direction and in the presence of Azalia Bilis, MD . Electronically Signed: Levon Hedger, Scribe. 01/11/2017. 12:24 AM.   History   Chief Complaint Chief Complaint  Patient presents with  . Eye Pain   HPI Adrienne Crawford is a 26 y.o. female who presents to the Emergency Department complaining of recurrent left lateral eye pain onset today. She describes her pain as constant, severe, scratching pain. Eye pain is exacerbated by opening eyes and alleviated by closing eyes. She reports associated swelling, blurred vision, and erythema. Pt states she has experienced similar symptoms a few times this month, but today was the first time it did not resolve a few hours after waking up. She does not wear contacts. Pt states she wears makeup daily and occasionally sleeps wearing makeup. Pt has no other acute complaints or associated symptoms at this time.    The history is provided by the patient. No language interpreter was used.   Past Medical History:  Diagnosis Date  . Anemia 02/07/2013  . Anxiety   . Asthma   . Depression   . Panic attack   . PID (acute pelvic inflammatory disease)   . Pregnant   . UTI (urinary tract infection)     Patient Active Problem List   Diagnosis Date Noted  . Anemia 02/07/2013  . Depression 02/07/2013    Past Surgical History:  Procedure Laterality Date  . BLADDER SURGERY      OB History    Gravida Para Term Preterm AB Living   3 2 2   1 2    SAB TAB Ectopic Multiple Live Births   1       2       Home Medications    Prior to Admission medications   Medication Sig Start Date End Date Taking? Authorizing Provider  acetaminophen (TYLENOL) 325 MG tablet Take 650 mg by mouth every 6 (six) hours as needed for pain.    [provider]  ibuprofen  (ADVIL,MOTRIN) 600 MG tablet Take 1 tablet (600 mg total) by mouth every 6 (six) hours as needed. 08/29/14   Burgess Amor, PA-C  traMADol (ULTRAM) 50 MG tablet Take 1 tablet (50 mg total) by mouth every 6 (six) hours as needed. 11/04/16   Gerhard Munch, MD   Family History Family History  Problem Relation Age of Onset  . Hypertension Paternal Grandfather   . Stroke Paternal Grandfather   . Cancer Maternal Grandmother   . Hypertension Maternal Grandmother   . Cancer Maternal Grandfather   . Hypertension Father   . Stroke Father   . Cancer Mother   . Hypertension Mother   . Cancer Other        Lung, Throat annd Mouth    Social History Social History  Substance Use Topics  . Smoking status: Current Every Day Smoker    Packs/day: 4.00    Types: Cigarettes  . Smokeless tobacco: Never Used     Comment: Quit smoking with positive preg. test.  . Alcohol use Yes     Comment: occassional    Allergies   Sulfa antibiotics   Review of Systems Review of Systems  All systems reviewed and are negative for acute change except as noted in the HPI.   Physical Exam Updated Vital Signs Ht 5\' 6"  (1.676 m)  Wt 171 lb (77.6 kg)   BMI 27.60 kg/m   Physical Exam  Constitutional: She appears well-developed.  HENT:  Head: Normocephalic.  Eyes: EOM and lids are normal. Pupils are equal, round, and reactive to light. Lids are everted and swept, no foreign bodies found. Right eye exhibits no chemosis, no exudate and no hordeolum. No foreign body present in the right eye. Left eye exhibits no chemosis, no exudate and no hordeolum. No foreign body present in the left eye. Left conjunctiva is injected. Left eye exhibits normal extraocular motion.  Slit lamp exam:      The left eye shows corneal abrasion.  Neck: Normal range of motion.  Pulmonary/Chest: Effort normal.  Abdominal: She exhibits no distension.  Musculoskeletal: Normal range of motion.  Neurological: She is alert.  Skin: Skin is  warm.  Psychiatric: She has a normal mood and affect.  Nursing note and vitals reviewed.   ED Treatments / Results  DIAGNOSTIC STUDIES:  Oxygen Saturation is 100% on RA, normal by my interpretation.    COORDINATION OF CARE:  12:23 AM Discussed treatment plan with pt at bedside and pt agreed to plan.   Labs (all labs ordered are listed, but only abnormal results are displayed) Labs Reviewed - No data to display  EKG  EKG Interpretation None       Radiology No results found.  Procedures Procedures (including critical care time)  Medications Ordered in ED Medications - No data to display   Initial Impression / Assessment and Plan / ED Course  I have reviewed the triage vital signs and the nursing notes.  Pertinent labs & imaging results that were available during my care of the patient were reviewed by me and considered in my medical decision making (see chart for details).     Corneal abrasion.  Home on Cipro drops.  Patient understands return the ER for new or worsening symptoms.  Patient does not wear contact lenses.  Final Clinical Impressions(s) / ED Diagnoses   Final diagnoses:  Abrasion of left cornea, initial encounter    New Prescriptions New Prescriptions   No medications on file   I personally performed the services described in this documentation, which was scribed in my presence. The recorded information has been reviewed and is accurate.        Azalia Bilisampos, Ethan Clayburn, MD 01/11/17 Georgiann [id number]

## 2017-01-11 NOTE — ED Triage Notes (Signed)
Pt c/o left eye pain since last night; pt's eye is red and swollen with the cornea area red

## 2017-04-06 ENCOUNTER — Emergency Department (HOSPITAL_COMMUNITY)
Admission: EM | Admit: 2017-04-06 | Discharge: 2017-04-06 | Disposition: A | Discharge status: Home / Self Care | Payer: Medicaid Other | Attending: Emergency Medicine | Admitting: Emergency Medicine

## 2017-04-06 ENCOUNTER — Emergency Department (HOSPITAL_COMMUNITY): Payer: Medicaid Other

## 2017-04-06 ENCOUNTER — Encounter (HOSPITAL_COMMUNITY): Payer: Self-pay | Admitting: *Deleted

## 2017-04-06 DIAGNOSIS — S0081XA Abrasion of other part of head, initial encounter: Secondary | ICD-10-CM | POA: Diagnosis not present

## 2017-04-06 DIAGNOSIS — F17210 Nicotine dependence, cigarettes, uncomplicated: Secondary | ICD-10-CM | POA: Diagnosis not present

## 2017-04-06 DIAGNOSIS — S65312A Laceration of deep palmar arch of left hand, initial encounter: Secondary | ICD-10-CM | POA: Diagnosis not present

## 2017-04-06 DIAGNOSIS — Z79899 Other long term (current) drug therapy: Secondary | ICD-10-CM | POA: Diagnosis not present

## 2017-04-06 DIAGNOSIS — W19XXXA Unspecified fall, initial encounter: Secondary | ICD-10-CM | POA: Insufficient documentation

## 2017-04-06 DIAGNOSIS — S60922A Unspecified superficial injury of left hand, initial encounter: Secondary | ICD-10-CM | POA: Diagnosis present

## 2017-04-06 DIAGNOSIS — M542 Cervicalgia: Secondary | ICD-10-CM | POA: Diagnosis not present

## 2017-04-06 DIAGNOSIS — Y939 Activity, unspecified: Secondary | ICD-10-CM | POA: Diagnosis not present

## 2017-04-06 DIAGNOSIS — J45909 Unspecified asthma, uncomplicated: Secondary | ICD-10-CM | POA: Insufficient documentation

## 2017-04-06 DIAGNOSIS — Z23 Encounter for immunization: Secondary | ICD-10-CM | POA: Diagnosis not present

## 2017-04-06 DIAGNOSIS — M545 Low back pain, unspecified: Secondary | ICD-10-CM

## 2017-04-06 DIAGNOSIS — S098XXA Other specified injuries of head, initial encounter: Secondary | ICD-10-CM | POA: Insufficient documentation

## 2017-04-06 DIAGNOSIS — Y999 Unspecified external cause status: Secondary | ICD-10-CM | POA: Insufficient documentation

## 2017-04-06 DIAGNOSIS — Y929 Unspecified place or not applicable: Secondary | ICD-10-CM | POA: Insufficient documentation

## 2017-04-06 LAB — POC URINE PREG, ED: Preg Test, Ur: NEGATIVE

## 2017-04-06 MED ORDER — TETANUS-DIPHTH-ACELL PERTUSSIS 5-2.5-18.5 LF-MCG/0.5 IM SUSP
0.5000 mL | Freq: Once | INTRAMUSCULAR | Status: AC
  Administered 2017-04-06: 0.5 mL via INTRAMUSCULAR
  Filled 2017-04-06: qty 0.5

## 2017-04-06 MED ORDER — NAPROXEN 250 MG PO TABS
500.0000 mg | ORAL_TABLET | Freq: Once | ORAL | Status: AC
  Administered 2017-04-06: 500 mg via ORAL
  Filled 2017-04-06: qty 2

## 2017-04-06 MED ORDER — BUPIVACAINE HCL (PF) 0.5 % IJ SOLN
10.0000 mL | Freq: Once | INTRAMUSCULAR | Status: AC
  Administered 2017-04-06: 10 mL

## 2017-04-06 MED ORDER — POVIDONE-IODINE 10 % EX SOLN
CUTANEOUS | Status: AC
  Administered 2017-04-06: 3
  Filled 2017-04-06: qty 75

## 2017-04-06 MED ORDER — CEPHALEXIN 500 MG PO CAPS
500.0000 mg | ORAL_CAPSULE | Freq: Once | ORAL | Status: AC
  Administered 2017-04-06: 500 mg via ORAL
  Filled 2017-04-06: qty 1

## 2017-04-06 MED ORDER — CYCLOBENZAPRINE HCL 5 MG PO TABS: 5.0000 mg | ORAL_TABLET | Freq: Three times a day (TID) | ORAL | 0 refills | Status: DC | PRN

## 2017-04-06 MED ORDER — CEPHALEXIN 500 MG PO CAPS: 500.0000 mg | ORAL_CAPSULE | Freq: Three times a day (TID) | ORAL | 0 refills | Status: DC

## 2017-04-06 MED ORDER — BUPIVACAINE HCL (PF) 0.5 % IJ SOLN
INTRAMUSCULAR | Status: AC
  Administered 2017-04-06: 10 mL
  Filled 2017-04-06: qty 30

## 2017-04-06 MED ORDER — NAPROXEN 500 MG PO TABS: ORAL_TABLET | ORAL | 0 refills | Status: DC

## 2017-04-06 NOTE — Progress Notes (Signed)
Pt removed belly ring for lumbar spine xray/placed in sealed plastic bag and given to pt

## 2017-04-06 NOTE — Discharge Instructions (Signed)
Keep the wounds clean and dry. Take the antibiotic until gone. You can use antibiotic ointment on the wounds. Use ice to the painful areas. The sutures need to be removed in about 10 days. Recheck sooner if it appears to be infected. You can take the medications prescribed for your pain and you can also take acetaminophen 650 mg 4 times a day with it for pain.  Return to the ED for any problems listed on the head injury sheet.

## 2017-04-06 NOTE — ED Provider Notes (Signed)
[facility]-EMERGENCY DEPT Provider Note   CSN: [id number] Arrival date & time: 04/06/17  0134  Time seen 03:25 AM   History   Chief Complaint Chief Complaint  Patient presents with  . Fall    HPI ROBBY PIRANI is a 26 y.o. female.  HPI  Patient is sleeping. When I wake her up she states "the onion is in [employer]", several times. She then told me "the monkey needs to sh*t" several times. She then becomes angry with me because I don't understand what she talking about. She even at one point said something about the nurses and I asked her if this happened at work or at home. Patient's noted to have blood on her left hand. She takes her finger and moves it and states that  there is something wrong. She will not remove the dressing off her thumb. Patient falls back asleep, when I wake her up she states "The pain is so bad it makes me go to sleep". She does not know when her last tetanus was. Pt is right handed.   PCP none  Past Medical History:  Diagnosis Date  . Anemia 02/07/2013  . Anxiety   . Asthma   . Depression   . Panic attack   . PID (acute pelvic inflammatory disease)   . Pregnant   . UTI (urinary tract infection)     Patient Active Problem List   Diagnosis Date Noted  . Anemia 02/07/2013  . Depression 02/07/2013    Past Surgical History:  Procedure Laterality Date  . BLADDER SURGERY      OB History    Gravida Para Term Preterm AB Living   3 2 2   1 2    SAB TAB Ectopic Multiple Live Births   1       2       Home Medications    Prior to Admission medications   Medication Sig Start Date End Date Taking? Authorizing Provider  acetaminophen (TYLENOL) 325 MG tablet Take 650 mg by mouth every 6 (six) hours as needed for pain.    [provider]  cephALEXin (KEFLEX) 500 MG capsule Take 1 capsule (500 mg total) by mouth 3 (three) times daily. 04/06/17   Devoria Albe, MD  cyclobenzaprine (FLEXERIL) 5 MG tablet Take 1 tablet (5 mg total) by mouth 3 (three)  times daily as needed. 04/06/17   Devoria Albe, MD  ibuprofen (ADVIL,MOTRIN) 600 MG tablet Take 1 tablet (600 mg total) by mouth every 6 (six) hours as needed. 08/29/14   Burgess Amor, PA-C  naproxen (NAPROSYN) 500 MG tablet Take 1 po BID with food prn pain 04/06/17   Devoria Albe, MD    Family History Family History  Problem Relation Age of Onset  . Hypertension Paternal Grandfather   . Stroke Paternal Grandfather   . Cancer Maternal Grandmother   . Hypertension Maternal Grandmother   . Cancer Maternal Grandfather   . Hypertension Father   . Stroke Father   . Cancer Mother   . Hypertension Mother   . Cancer Other        Lung, Throat annd Mouth    Social History Social History  Substance Use Topics  . Smoking status: Current Every Day Smoker    Packs/day: 4.00    Types: Cigarettes  . Smokeless tobacco: Never Used     Comment: Quit smoking with positive preg. test.  . Alcohol use Yes     Comment: occassional     Allergies  Sulfa antibiotics   Review of Systems Review of Systems  All other systems reviewed and are negative.    Physical Exam Updated Vital Signs BP (!) 104/56   Pulse 97   Temp 98.7 F (37.1 C) (Oral)   Resp 19   Ht 5\' 4"  (1.626 m)   Wt 76.2 kg (168 lb)   SpO2 99%   BMI 28.84 kg/m   Vital signs normal    Physical Exam  Constitutional: She is oriented to person, place, and time. She appears well-developed and well-nourished.  Non-toxic appearance. She does not appear ill. She appears distressed.  Patient arrived hyperventilating. During my exam she keeps falling asleep.  HENT:  Head: Normocephalic.    Right Ear: External ear normal.  Left Ear: External ear normal.  Nose: Nose normal. No mucosal edema or rhinorrhea.  Mouth/Throat: Oropharynx is clear and moist and mucous membranes are normal. No dental abscesses or uvula swelling.  Patient has a very tiny skin tear lateral to her right edge of her eye that is well approximated. It had been  bleeding but it's not currently bleeding.  Her face is tender diffusely on the right side without obvious deformity or bruising. She also states she's tender in the right side of her head.  Eyes: Pupils are equal, round, and reactive to light. Conjunctivae and EOM are normal.  Neck: Normal range of motion and full passive range of motion without pain. Neck supple.  Cardiovascular: Normal rate, regular rhythm and normal heart sounds.  Exam reveals no gallop and no friction rub.   No murmur heard. Pulmonary/Chest: Effort normal and breath sounds normal. No respiratory distress. She has no wheezes. She has no rhonchi. She has no rales. She exhibits no tenderness and no crepitus.  Abdominal: Soft. Normal appearance and bowel sounds are normal. She exhibits no distension. There is no tenderness. There is no rebound and no guarding.  Musculoskeletal: Normal range of motion. She exhibits no edema or tenderness.  Moves all extremities well.  Patient complains of pain in her cervical spine and she is tender diffusely without localization. She is noted to curl up and move her head freely without difficulty.  Patient's noted to have a 2 cm flap laceration on the thenar eminence of herleft hand. There is no active bleeding. She also has a 1-1/2 cm laceration in the webspace between theleft middle and left ring fingers. She complains of pain when I move it however when she first showed it to me she was moving her fingers around to show me how it was not normal.  Patient complains of diffuse tenderness of her lumbar spine. There is no bruising, abrasions, or swelling seen.  Neurological: She is alert and oriented to person, place, and time. She has normal strength. No cranial nerve deficit.  Skin: Skin is warm, dry and intact. No rash noted. No erythema. No pallor.  Psychiatric: She has a normal mood and affect. Her speech is normal and behavior is normal. Her mood appears not anxious.  Nursing note and vitals  reviewed.    ED Treatments / Results  Labs (all labs ordered are listed, but only abnormal results are displayed) Labs Reviewed  POC URINE PREG, ED    EKG  EKG Interpretation None       Radiology Dg Cervical Spine Complete  Result Date: 04/06/2017 CLINICAL DATA:  Status post fall down steps, with neck pain. Initial encounter. EXAM: CERVICAL SPINE - COMPLETE 4+ VIEW COMPARISON:  CT of the cervical  spine performed 11/09/2016 FINDINGS: There is no evidence of fracture or subluxation. Vertebral bodies demonstrate normal height and alignment. Intervertebral disc spaces are preserved. Prevertebral soft tissues are within normal limits. The provided odontoid view demonstrates no significant abnormality. The visualized lung apices are clear. IMPRESSION: No evidence of fracture or subluxation along the cervical spine. Electronically Signed   By: Roanna Raider M.D.   On: 04/06/2017 06:16   Dg Lumbar Spine Complete  Result Date: 04/06/2017 CLINICAL DATA:  Status post fall down steps, with lower back pain. Initial encounter. EXAM: LUMBAR SPINE - COMPLETE 4+ VIEW COMPARISON:  CT of the abdomen and pelvis performed 11/04/2016 FINDINGS: There is no evidence of fracture or subluxation. Vertebral bodies demonstrate normal height and alignment. Intervertebral disc spaces are preserved. The visualized neural foramina are grossly unremarkable in appearance. The visualized bowel gas pattern is unremarkable in appearance; a large amount of stool is noted in the colon. The sacroiliac joints are within normal limits. IMPRESSION: 1. No evidence of fracture or subluxation along the lumbar spine. 2. Large amount of stool noted in the colon, concerning for constipation. Electronically Signed   By: Roanna Raider M.D.   On: 04/06/2017 06:15   Ct Head Wo Contrast Ct Maxillofacial Wo Cm  Result Date: 04/06/2017 CLINICAL DATA:  Laceration to the right eyebrow. Status post fall down 15 steps with lawnmower. Concern  for head injury. Initial encounter. EXAM: CT HEAD WITHOUT CONTRAST CT MAXILLOFACIAL WITHOUT CONTRAST TECHNIQUE: Multidetector CT imaging of the head and maxillofacial structures were performed using the standard protocol without intravenous contrast. Multiplanar CT image reconstructions of the maxillofacial structures were also generated. COMPARISON:  CT of the head and maxillofacial structures performed 11/09/2016 FINDINGS: CT HEAD FINDINGS Brain: No evidence of acute infarction, hemorrhage, hydrocephalus, extra-axial collection or mass lesion/mass effect. The posterior fossa, including the cerebellum, brainstem and fourth ventricle, is within normal limits. The third and lateral ventricles, and basal ganglia are unremarkable in appearance. The cerebral hemispheres are symmetric in appearance, with normal gray-white differentiation. No mass effect or midline shift is seen. Vascular: No hyperdense vessel or unexpected calcification. Skull: There is no evidence of fracture; visualized osseous structures are unremarkable in appearance. Other: No significant soft tissue abnormalities are seen. CT MAXILLOFACIAL FINDINGS Osseous: There is no evidence of fracture or dislocation. The maxilla and mandible appear intact. The nasal bone is unremarkable in appearance. The visualized dentition demonstrates no acute abnormality. Orbits: The orbits are intact bilaterally. Sinuses: Mucus retention cysts or polyps are noted at the left maxillary sinus. The remaining visualized paranasal sinuses and mastoid air cells are well-aerated. Soft tissues: Mild soft tissue swelling is noted lateral to the right orbit. The parapharyngeal fat planes are preserved. The nasopharynx, oropharynx and hypopharynx are unremarkable in appearance. The visualized portions of the valleculae and piriform sinuses are grossly unremarkable. The parotid and submandibular glands are within normal limits. No cervical lymphadenopathy is seen. IMPRESSION: 1. No  evidence of traumatic intracranial injury or fracture. 2. No evidence of fracture or dislocation with regard to the maxillofacial structures. 3. Mild soft tissue swelling lateral to the right orbit. 4. Mucus retention cysts or polyps at the left maxillary sinus. Electronically Signed   By: Roanna Raider M.D.   On: 04/06/2017 06:03   Dg Hand Complete Left  Result Date: 04/06/2017 CLINICAL DATA:  Laceration to the left hand EXAM: LEFT HAND - COMPLETE 3+ VIEW COMPARISON:  None. FINDINGS: There is no evidence of fracture or dislocation. There is no evidence  of arthropathy or other focal bone abnormality. Laceration adjacent to the first metacarpal. No radiopaque foreign body. IMPRESSION: No acute osseous abnormality Electronically Signed   By: Jasmine Pang M.D.   On: 04/06/2017 03:57        Procedures .Marland KitchenLaceration Repair Date/Time: 04/06/2017 6:20 AM Performed by: Lynelle Doctor, Huie Ghuman Authorized by: Devoria Albe   Consent:    Consent obtained:  Verbal   Consent given by:  Patient Anesthesia (see MAR for exact dosages):    Anesthesia method:  Local infiltration   Local anesthetic:  Bupivacaine 0.5% w/o epi Laceration details:    Location:  Finger   Finger location:  L thumb   Length (cm):  2 Repair type:    Repair type:  Simple Pre-procedure details:    Preparation:  Patient was prepped and draped in usual sterile fashion and imaging obtained to evaluate for foreign bodies Exploration:    Wound exploration: wound explored through full range of motion     Contaminated: no   Treatment:    Area cleansed with:  Betadine and saline   Amount of cleaning:  Standard   Visualized foreign bodies/material removed: no   Skin repair:    Repair method:  Sutures   Suture size:  4-0   Suture material:  Nylon   Suture technique:  Simple interrupted   Number of sutures:  4 Approximation:    Approximation:  Loose   Vermilion border: well-aligned   Post-procedure details:    Dressing:  Antibiotic  ointment and non-adherent dressing   Patient tolerance of procedure:  Tolerated well, no immediate complications Comments:     The wound was loosely approximated because patient states every needlestick, every time of the knots, and cutting the excess thread off the sutures hurt. She also stated that the numbness was wearing off and I informed her that the one I used lasts many hours. Marland Kitchen.Laceration Repair Date/Time: 04/06/2017 6:24 AM Performed by: Lynelle Doctor, Catheline Hixon Authorized by: Devoria Albe   Consent:    Consent obtained:  Verbal   Consent given by:  Patient Anesthesia (see MAR for exact dosages):    Anesthesia method:  Local infiltration   Local anesthetic:  Bupivacaine 0.5% w/o epi Laceration details:    Location:  Hand   Hand location: web space between LMF and LRF.   Length (cm):  1.5 Repair type:    Repair type:  Simple Pre-procedure details:    Preparation:  Patient was prepped and draped in usual sterile fashion and imaging obtained to evaluate for foreign bodies Exploration:    Wound exploration: wound explored through full range of motion and entire depth of wound probed and visualized     Wound extent: no foreign bodies/material noted     Contaminated: no   Treatment:    Area cleansed with:  Betadine and saline   Amount of cleaning:  Standard Skin repair:    Repair method:  Sutures   Suture size:  4-0   Suture material:  Nylon   Number of sutures:  3 Approximation:    Approximation:  Loose   Vermilion border: well-aligned   Post-procedure details:    Dressing:  Antibiotic ointment and non-adherent dressing Comments:     The wound was loosely approximated because patient states every needlestick, every time of the knots, and cutting the excess thread off the sutures hurt. She also stated that the numbness was wearing off and I informed her that the one I used lasts many hours. Also this laceration was  well approximated when the fingers were in normal position due to its  location.   (including critical care time)  Medications Ordered in ED Medications  cephALEXin (KEFLEX) capsule 500 mg (not administered)  naproxen (NAPROSYN) tablet 500 mg (not administered)  povidone-iodine (BETADINE) 10 % external solution (3 application  Given 04/06/17 0453)  Tdap (BOOSTRIX) injection 0.5 mL (0.5 mLs Intramuscular Given 04/06/17 0406)  bupivacaine (MARCAINE) 0.5 % injection 10 mL (10 mLs Infiltration Given 04/06/17 0454)     Initial Impression / Assessment and Plan / ED Course  I have reviewed the triage vital signs and the nursing notes.  Pertinent labs & imaging results that were available during my care of the patient were reviewed by me and considered in my medical decision making (see chart for details).      Patient appears to be under the influence of something whether alcohol or drugs it is not clear.  Patient's tetanus status was updated. X-ray was obtained of her hand to look for fractures due to her complaints of pain.   Patient seemed more awake after she came back from x-ray. Now she states there was a onion at the top of the stairs and she fell down the stairs possibly 15 steps. She states that stairs were outside. She states when she fell she may have had some loss of consciousness she's not sure. After she was sutured she was now more cooperative. She shows me the small superficial abrasion near her right eye and complains of headache, neck pain and back pain.x-rays and CT scans were ordered for these areas of new injury.  After reviewing patient's radiology studies she was discharged. Patient was very histrionic during her ED visit. I suspect this is some drug-seeking behavior.  Final Clinical Impressions(s) / ED Diagnoses   Final diagnoses:  Laceration of deep palmar arch of left hand, initial encounter  Abrasion, face w/o infection  Fall, initial encounter  Acute midline low back pain without sciatica  Neck pain    New Prescriptions New  Prescriptions   CEPHALEXIN (KEFLEX) 500 MG CAPSULE    Take 1 capsule (500 mg total) by mouth 3 (three) times daily.   CYCLOBENZAPRINE (FLEXERIL) 5 MG TABLET    Take 1 tablet (5 mg total) by mouth 3 (three) times daily as needed.   NAPROXEN (NAPROSYN) 500 MG TABLET    Take 1 po BID with food prn pain    Plan discharge  Devoria Albe, MD, Concha Pyo, MD 04/06/17 [phone]

## 2017-04-06 NOTE — ED Triage Notes (Signed)
Pt c/o cut to left hand and laceration to right eyebrow; pt states she fell down 15 stairs with a lawnmower

## 2017-09-04 ENCOUNTER — Other Ambulatory Visit: Payer: Self-pay

## 2017-09-04 ENCOUNTER — Emergency Department (HOSPITAL_COMMUNITY)
Admission: EM | Admit: 2017-09-04 | Discharge: 2017-09-05 | Disposition: A | Discharge status: Other Acute Inpatient Hospital | Payer: Medicaid Other | Attending: Emergency Medicine | Admitting: Emergency Medicine

## 2017-09-04 DIAGNOSIS — Y999 Unspecified external cause status: Secondary | ICD-10-CM | POA: Insufficient documentation

## 2017-09-04 DIAGNOSIS — X789XXA Intentional self-harm by unspecified sharp object, initial encounter: Secondary | ICD-10-CM | POA: Diagnosis not present

## 2017-09-04 DIAGNOSIS — S40812A Abrasion of left upper arm, initial encounter: Secondary | ICD-10-CM | POA: Insufficient documentation

## 2017-09-04 DIAGNOSIS — Y939 Activity, unspecified: Secondary | ICD-10-CM | POA: Diagnosis not present

## 2017-09-04 DIAGNOSIS — S80812A Abrasion, left lower leg, initial encounter: Secondary | ICD-10-CM | POA: Insufficient documentation

## 2017-09-04 DIAGNOSIS — F17210 Nicotine dependence, cigarettes, uncomplicated: Secondary | ICD-10-CM | POA: Insufficient documentation

## 2017-09-04 DIAGNOSIS — R45851 Suicidal ideations: Secondary | ICD-10-CM | POA: Insufficient documentation

## 2017-09-04 DIAGNOSIS — F332 Major depressive disorder, recurrent severe without psychotic features: Secondary | ICD-10-CM | POA: Diagnosis present

## 2017-09-04 DIAGNOSIS — Y929 Unspecified place or not applicable: Secondary | ICD-10-CM | POA: Diagnosis not present

## 2017-09-04 DIAGNOSIS — S80811A Abrasion, right lower leg, initial encounter: Secondary | ICD-10-CM | POA: Diagnosis not present

## 2017-09-04 LAB — CBC WITH DIFFERENTIAL/PLATELET
BASOS ABS: 0 10*3/uL (ref 0.0–0.1)
BASOS PCT: 0 %
EOS ABS: 0.1 10*3/uL (ref 0.0–0.7)
EOS PCT: 2 %
HCT: 46.5 % — ABNORMAL HIGH (ref 36.0–46.0)
Hemoglobin: 16.7 g/dL — ABNORMAL HIGH (ref 12.0–15.0)
LYMPHS PCT: 34 %
Lymphs Abs: 1.9 10*3/uL (ref 0.7–4.0)
MCH: 35.8 pg — ABNORMAL HIGH (ref 26.0–34.0)
MCHC: 35.9 g/dL (ref 30.0–36.0)
MCV: 99.6 fL (ref 78.0–100.0)
Monocytes Absolute: 0.6 10*3/uL (ref 0.1–1.0)
Monocytes Relative: 12 %
Neutro Abs: 2.9 10*3/uL (ref 1.7–7.7)
Neutrophils Relative %: 52 %
PLATELETS: 130 10*3/uL — AB (ref 150–400)
RBC: 4.67 MIL/uL (ref 3.87–5.11)
RDW: 13.2 % (ref 11.5–15.5)
WBC: 5.6 10*3/uL (ref 4.0–10.5)

## 2017-09-04 LAB — RAPID URINE DRUG SCREEN, HOSP PERFORMED
AMPHETAMINES: POSITIVE — AB
BARBITURATES: NOT DETECTED
BENZODIAZEPINES: POSITIVE — AB
Cocaine: POSITIVE — AB
Opiates: NOT DETECTED
TETRAHYDROCANNABINOL: POSITIVE — AB

## 2017-09-04 LAB — COMPREHENSIVE METABOLIC PANEL
ALBUMIN: 4.4 g/dL (ref 3.5–5.0)
ALT: 24 U/L (ref 14–54)
AST: 36 U/L (ref 15–41)
Alkaline Phosphatase: 63 U/L (ref 38–126)
Anion gap: 8 (ref 5–15)
BILIRUBIN TOTAL: 1 mg/dL (ref 0.3–1.2)
BUN: 13 mg/dL (ref 6–20)
CO2: 27 mmol/L (ref 22–32)
Calcium: 9.8 mg/dL (ref 8.9–10.3)
Chloride: 102 mmol/L (ref 101–111)
Creatinine, Ser: 0.96 mg/dL (ref 0.44–1.00)
GFR calc Af Amer: >60 mL/min (ref >60)
GFR calc non Af Amer: >60 mL/min (ref >60)
GLUCOSE: 80 mg/dL (ref 65–99)
POTASSIUM: 4.4 mmol/L (ref 3.5–5.1)
SODIUM: 137 mmol/L (ref 135–145)
TOTAL PROTEIN: 7.8 g/dL (ref 6.5–8.1)

## 2017-09-04 LAB — ACETAMINOPHEN LEVEL: Acetaminophen (Tylenol), Serum: <10 ug/mL — ABNORMAL LOW (ref 10–30)

## 2017-09-04 LAB — ETHANOL: Alcohol, Ethyl (B): <10 mg/dL (ref <10)

## 2017-09-04 LAB — I-STAT BETA HCG BLOOD, ED (MC, WL, AP ONLY)

## 2017-09-04 LAB — SALICYLATE LEVEL

## 2017-09-04 MED ORDER — ACETAMINOPHEN 325 MG PO TABS
650.0000 mg | ORAL_TABLET | Freq: Once | ORAL | Status: AC
  Administered 2017-09-04: 650 mg via ORAL
  Filled 2017-09-04: qty 2

## 2017-09-04 MED ORDER — BACITRACIN ZINC 500 UNIT/GM EX OINT
TOPICAL_OINTMENT | CUTANEOUS | Status: AC
  Administered 2017-09-04: 1
  Filled 2017-09-04: qty 1.8

## 2017-09-04 MED ORDER — LORAZEPAM 2 MG/ML IJ SOLN: 0.0000 mg | Freq: Two times a day (BID) | INTRAMUSCULAR | Status: DC

## 2017-09-04 MED ORDER — LORAZEPAM 2 MG/ML IJ SOLN: 0.0000 mg | Freq: Four times a day (QID) | INTRAMUSCULAR | Status: DC

## 2017-09-04 MED ORDER — THIAMINE HCL 100 MG/ML IJ SOLN: 100.0000 mg | Freq: Every day | INTRAMUSCULAR | Status: DC

## 2017-09-04 MED ORDER — LORAZEPAM 1 MG PO TABS: 0.0000 mg | ORAL_TABLET | Freq: Two times a day (BID) | ORAL | Status: DC

## 2017-09-04 MED ORDER — LORAZEPAM 1 MG PO TABS
0.0000 mg | ORAL_TABLET | Freq: Four times a day (QID) | ORAL | Status: DC
  Administered 2017-09-04: 1 mg via ORAL
  Filled 2017-09-04: qty 1

## 2017-09-04 MED ORDER — VITAMIN B-1 100 MG PO TABS
100.0000 mg | ORAL_TABLET | Freq: Every day | ORAL | Status: DC
  Administered 2017-09-04: 100 mg via ORAL
  Filled 2017-09-04: qty 1

## 2017-09-04 NOTE — ED Notes (Signed)
Bed: WA27 Expected date:  Expected time:  Means of arrival:  Comments: 
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
## 2017-09-04 NOTE — ED Notes (Signed)
[facility]
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
## 2017-09-05 ENCOUNTER — Encounter (HOSPITAL_COMMUNITY): Payer: Self-pay | Admitting: *Deleted

## 2017-09-05 ENCOUNTER — Other Ambulatory Visit: Payer: Self-pay

## 2017-09-05 ENCOUNTER — Inpatient Hospital Stay (HOSPITAL_COMMUNITY)
Admission: AD | Admit: 2017-09-05 | Discharge: 2017-09-09 | DRG: 885 | Disposition: A | Discharge status: Home / Self Care | Payer: Medicaid Other | Source: Intra-hospital | Attending: Psychiatry | Admitting: Psychiatry

## 2017-09-05 DIAGNOSIS — M549 Dorsalgia, unspecified: Secondary | ICD-10-CM | POA: Diagnosis not present

## 2017-09-05 DIAGNOSIS — R450 Nervousness: Secondary | ICD-10-CM

## 2017-09-05 DIAGNOSIS — Z91410 Personal history of adult physical and sexual abuse: Secondary | ICD-10-CM | POA: Diagnosis not present

## 2017-09-05 DIAGNOSIS — Z882 Allergy status to sulfonamides status: Secondary | ICD-10-CM | POA: Diagnosis not present

## 2017-09-05 DIAGNOSIS — M62838 Other muscle spasm: Secondary | ICD-10-CM | POA: Diagnosis present

## 2017-09-05 DIAGNOSIS — R45851 Suicidal ideations: Secondary | ICD-10-CM | POA: Diagnosis present

## 2017-09-05 DIAGNOSIS — G4700 Insomnia, unspecified: Secondary | ICD-10-CM | POA: Diagnosis present

## 2017-09-05 DIAGNOSIS — F4310 Post-traumatic stress disorder, unspecified: Secondary | ICD-10-CM | POA: Diagnosis present

## 2017-09-05 DIAGNOSIS — J45909 Unspecified asthma, uncomplicated: Secondary | ICD-10-CM | POA: Diagnosis present

## 2017-09-05 DIAGNOSIS — F332 Major depressive disorder, recurrent severe without psychotic features: Principal | ICD-10-CM | POA: Diagnosis present

## 2017-09-05 DIAGNOSIS — F410 Panic disorder [episodic paroxysmal anxiety] without agoraphobia: Secondary | ICD-10-CM | POA: Diagnosis present

## 2017-09-05 DIAGNOSIS — F419 Anxiety disorder, unspecified: Secondary | ICD-10-CM | POA: Diagnosis not present

## 2017-09-05 DIAGNOSIS — Z915 Personal history of self-harm: Secondary | ICD-10-CM | POA: Diagnosis not present

## 2017-09-05 DIAGNOSIS — F1920 Other psychoactive substance dependence, uncomplicated: Secondary | ICD-10-CM | POA: Diagnosis not present

## 2017-09-05 DIAGNOSIS — F1324 Sedative, hypnotic or anxiolytic dependence with sedative, hypnotic or anxiolytic-induced mood disorder: Secondary | ICD-10-CM | POA: Diagnosis present

## 2017-09-05 DIAGNOSIS — Z79899 Other long term (current) drug therapy: Secondary | ICD-10-CM | POA: Diagnosis not present

## 2017-09-05 DIAGNOSIS — F1410 Cocaine abuse, uncomplicated: Secondary | ICD-10-CM | POA: Diagnosis not present

## 2017-09-05 DIAGNOSIS — A549 Gonococcal infection, unspecified: Secondary | ICD-10-CM | POA: Diagnosis not present

## 2017-09-05 DIAGNOSIS — F13239 Sedative, hypnotic or anxiolytic dependence with withdrawal, unspecified: Secondary | ICD-10-CM | POA: Diagnosis present

## 2017-09-05 DIAGNOSIS — F1010 Alcohol abuse, uncomplicated: Secondary | ICD-10-CM

## 2017-09-05 DIAGNOSIS — F1210 Cannabis abuse, uncomplicated: Secondary | ICD-10-CM | POA: Diagnosis not present

## 2017-09-05 DIAGNOSIS — F1490 Cocaine use, unspecified, uncomplicated: Secondary | ICD-10-CM | POA: Diagnosis not present

## 2017-09-05 DIAGNOSIS — G43909 Migraine, unspecified, not intractable, without status migrainosus: Secondary | ICD-10-CM | POA: Diagnosis present

## 2017-09-05 DIAGNOSIS — F17210 Nicotine dependence, cigarettes, uncomplicated: Secondary | ICD-10-CM | POA: Diagnosis present

## 2017-09-05 DIAGNOSIS — Z793 Long term (current) use of hormonal contraceptives: Secondary | ICD-10-CM

## 2017-09-05 DIAGNOSIS — F1994 Other psychoactive substance use, unspecified with psychoactive substance-induced mood disorder: Secondary | ICD-10-CM | POA: Insufficient documentation

## 2017-09-05 DIAGNOSIS — Z91411 Personal history of adult psychological abuse: Secondary | ICD-10-CM | POA: Diagnosis not present

## 2017-09-05 DIAGNOSIS — F1290 Cannabis use, unspecified, uncomplicated: Secondary | ICD-10-CM | POA: Diagnosis not present

## 2017-09-05 MED ORDER — LORAZEPAM 1 MG PO TABS
1.0000 mg | ORAL_TABLET | Freq: Four times a day (QID) | ORAL | Status: DC | PRN
Start: 2017-09-05 — End: 2017-09-05
  Administered 2017-09-05: 1 mg via ORAL
  Filled 2017-09-05: qty 1

## 2017-09-05 MED ORDER — MAGNESIUM HYDROXIDE 400 MG/5ML PO SUSP: 30.0000 mL | Freq: Every day | ORAL | Status: DC | PRN

## 2017-09-05 MED ORDER — LORAZEPAM 1 MG PO TABS: 1.0000 mg | ORAL_TABLET | Freq: Two times a day (BID) | ORAL | Status: DC

## 2017-09-05 MED ORDER — GABAPENTIN 300 MG PO CAPS
300.0000 mg | ORAL_CAPSULE | Freq: Three times a day (TID) | ORAL | Status: DC
  Administered 2017-09-05 – 2017-09-09 (×13): 300 mg via ORAL
  Filled 2017-09-05 (×4): qty 1
  Filled 2017-09-05: qty 63
  Filled 2017-09-05 (×5): qty 1
  Filled 2017-09-05 (×2): qty 63
  Filled 2017-09-05: qty 1
  Filled 2017-09-05: qty 63
  Filled 2017-09-05 (×5): qty 1
  Filled 2017-09-05 (×3): qty 63

## 2017-09-05 MED ORDER — LORAZEPAM 1 MG PO TABS
1.0000 mg | ORAL_TABLET | Freq: Four times a day (QID) | ORAL | Status: DC
  Administered 2017-09-05: 1 mg via ORAL
  Filled 2017-09-05: qty 1

## 2017-09-05 MED ORDER — CHLORDIAZEPOXIDE HCL 25 MG PO CAPS
25.0000 mg | ORAL_CAPSULE | ORAL | Status: AC
  Administered 2017-09-07 (×2): 25 mg via ORAL
  Filled 2017-09-05 (×2): qty 1

## 2017-09-05 MED ORDER — MIRTAZAPINE 15 MG PO TABS
15.0000 mg | ORAL_TABLET | Freq: Every day | ORAL | Status: DC
  Administered 2017-09-05 – 2017-09-08 (×4): 15 mg via ORAL
  Filled 2017-09-05 (×2): qty 1
  Filled 2017-09-05 (×2): qty 21
  Filled 2017-09-05 (×3): qty 1

## 2017-09-05 MED ORDER — BACITRACIN-NEOMYCIN-POLYMYXIN OINTMENT TUBE
TOPICAL_OINTMENT | CUTANEOUS | Status: DC | PRN
  Administered 2017-09-07 – 2017-09-08 (×2): via TOPICAL
  Filled 2017-09-05: qty 14.17

## 2017-09-05 MED ORDER — HYDROXYZINE HCL 25 MG PO TABS
25.0000 mg | ORAL_TABLET | Freq: Four times a day (QID) | ORAL | Status: DC | PRN
  Administered 2017-09-05 – 2017-09-07 (×3): 25 mg via ORAL
  Filled 2017-09-05 (×3): qty 1

## 2017-09-05 MED ORDER — ADULT MULTIVITAMIN W/MINERALS CH
1.0000 | ORAL_TABLET | Freq: Every day | ORAL | Status: DC
  Administered 2017-09-05 – 2017-09-09 (×5): 1 via ORAL
  Filled 2017-09-05 (×7): qty 1

## 2017-09-05 MED ORDER — ARIPIPRAZOLE 5 MG PO TABS
5.0000 mg | ORAL_TABLET | Freq: Every day | ORAL | Status: DC
  Administered 2017-09-05 – 2017-09-09 (×5): 5 mg via ORAL
  Filled 2017-09-05: qty 1
  Filled 2017-09-05: qty 21
  Filled 2017-09-05 (×5): qty 1
  Filled 2017-09-05: qty 21
  Filled 2017-09-05: qty 1

## 2017-09-05 MED ORDER — ALBUTEROL SULFATE HFA 108 (90 BASE) MCG/ACT IN AERS
1.0000 | INHALATION_SPRAY | Freq: Four times a day (QID) | RESPIRATORY_TRACT | Status: DC | PRN
  Administered 2017-09-07 – 2017-09-09 (×4): 2 via RESPIRATORY_TRACT
  Filled 2017-09-05: qty 6.7

## 2017-09-05 MED ORDER — ACETAMINOPHEN 325 MG PO TABS
650.0000 mg | ORAL_TABLET | Freq: Four times a day (QID) | ORAL | Status: DC | PRN
  Administered 2017-09-05 – 2017-09-07 (×2): 650 mg via ORAL
  Filled 2017-09-05 (×3): qty 2

## 2017-09-05 MED ORDER — TRAZODONE HCL 50 MG PO TABS
50.0000 mg | ORAL_TABLET | Freq: Every evening | ORAL | Status: DC | PRN
  Administered 2017-09-06 – 2017-09-07 (×2): 50 mg via ORAL
  Filled 2017-09-05 (×2): qty 1

## 2017-09-05 MED ORDER — ENSURE ENLIVE PO LIQD: 237.0000 mL | Freq: Two times a day (BID) | ORAL | Status: DC

## 2017-09-05 MED ORDER — LORAZEPAM 1 MG PO TABS: 1.0000 mg | ORAL_TABLET | Freq: Three times a day (TID) | ORAL | Status: DC

## 2017-09-05 MED ORDER — CHLORDIAZEPOXIDE HCL 25 MG PO CAPS
25.0000 mg | ORAL_CAPSULE | Freq: Four times a day (QID) | ORAL | Status: AC
  Administered 2017-09-05 (×2): 25 mg via ORAL
  Filled 2017-09-05 (×2): qty 1

## 2017-09-05 MED ORDER — LORAZEPAM 1 MG PO TABS: 1.0000 mg | ORAL_TABLET | Freq: Every day | ORAL | Status: DC

## 2017-09-05 MED ORDER — ONDANSETRON 4 MG PO TBDP
4.0000 mg | ORAL_TABLET | Freq: Four times a day (QID) | ORAL | Status: DC | PRN
  Administered 2017-09-05: 4 mg via ORAL
  Filled 2017-09-05: qty 1

## 2017-09-05 MED ORDER — IBUPROFEN 600 MG PO TABS
ORAL_TABLET | ORAL | Status: AC
  Filled 2017-09-05: qty 1

## 2017-09-05 MED ORDER — NICOTINE 21 MG/24HR TD PT24
21.0000 mg | MEDICATED_PATCH | Freq: Every day | TRANSDERMAL | Status: DC
  Administered 2017-09-05 – 2017-09-08 (×4): 21 mg via TRANSDERMAL
  Filled 2017-09-05 (×7): qty 1

## 2017-09-05 MED ORDER — ALUM & MAG HYDROXIDE-SIMETH 200-200-20 MG/5ML PO SUSP: 30.0000 mL | ORAL | Status: DC | PRN

## 2017-09-05 MED ORDER — LOPERAMIDE HCL 2 MG PO CAPS: 2.0000 mg | ORAL_CAPSULE | ORAL | Status: DC | PRN

## 2017-09-05 MED ORDER — THIAMINE HCL 100 MG/ML IJ SOLN
100.0000 mg | Freq: Once | INTRAMUSCULAR | Status: AC
  Administered 2017-09-05: 100 mg via INTRAMUSCULAR
  Filled 2017-09-05: qty 2

## 2017-09-05 MED ORDER — CHLORDIAZEPOXIDE HCL 25 MG PO CAPS
25.0000 mg | ORAL_CAPSULE | Freq: Every day | ORAL | Status: AC
  Administered 2017-09-08: 25 mg via ORAL
  Filled 2017-09-05: qty 1

## 2017-09-05 MED ORDER — IBUPROFEN 600 MG PO TABS
600.0000 mg | ORAL_TABLET | Freq: Four times a day (QID) | ORAL | Status: DC | PRN
  Administered 2017-09-05 – 2017-09-09 (×6): 600 mg via ORAL
  Filled 2017-09-05 (×5): qty 1

## 2017-09-05 MED ORDER — CHLORDIAZEPOXIDE HCL 25 MG PO CAPS
25.0000 mg | ORAL_CAPSULE | Freq: Three times a day (TID) | ORAL | Status: AC
  Administered 2017-09-06 (×3): 25 mg via ORAL
  Filled 2017-09-05 (×3): qty 1

## 2017-09-05 MED ORDER — CHLORDIAZEPOXIDE HCL 25 MG PO CAPS
25.0000 mg | ORAL_CAPSULE | Freq: Four times a day (QID) | ORAL | Status: DC | PRN
  Administered 2017-09-06 – 2017-09-07 (×2): 25 mg via ORAL
  Filled 2017-09-05 (×3): qty 1

## 2017-09-05 MED ORDER — VITAMIN B-1 100 MG PO TABS
100.0000 mg | ORAL_TABLET | Freq: Every day | ORAL | Status: DC
  Administered 2017-09-06 – 2017-09-09 (×4): 100 mg via ORAL
  Filled 2017-09-05 (×6): qty 1

## 2017-09-05 NOTE — Progress Notes (Signed)
Recreation Therapy Notes  Date: 09/05/17 Time: 0930 Location: [address] Dayroom  Group Topic: Stress Management  Goal Area(s) Addresses:  Patient will verbalize importance of using healthy stress management.  Patient will identify positive emotions associated with healthy stress management.   Intervention: Stress Management  Activity :  [facility].  LRT introduced the stress management technique of guided imagery.  LRT read [place] script to allow patients to visualize being in [place] [facility] [place].  Patients were to follow along as script was read to engage in activity.  Education:  Stress Management, Discharge Planning.   Education Outcome: Acknowledges edcuation/In group clarification offered/Needs additional education  Clinical Observations/Feedback: Pt did not attend group.     Caroll RancherMarjette Pola Crawford, LRT/CTRS         Caroll RancherLindsay, Adrienne Crawford 09/05/2017 12:49 PM

## 2017-09-05 NOTE — Tx Team (Signed)
Initial Treatment Plan 09/05/2017 5:00 AM Hoover BrunetteKimberly T Pomerleau EXB:[id number]    PATIENT STRESSORS: Financial difficulties Legal issue Marital or family conflict Substance abuse Traumatic event   PATIENT STRENGTHS: Average or above average intelligence Motivation for treatment/growth Work skills   PATIENT IDENTIFIED PROBLEMS: Substance abuse  anxiety  depression  Suicidal ideation  "Get my anxiety and depression under control"  "Be able to live on my own and make it"           DISCHARGE CRITERIA:  Adequate post-discharge living arrangements Improved stabilization in mood, thinking, and/or behavior Motivation to continue treatment in a less acute level of care Need for constant or close observation no longer present Verbal commitment to aftercare and medication compliance Withdrawal symptoms are absent or subacute and managed without 24-hour nursing intervention  PRELIMINARY DISCHARGE PLAN: Attend aftercare/continuing care group Attend 12-step recovery group Outpatient therapy Placement in alternative living arrangements  PATIENT/FAMILY INVOLVEMENT: This treatment plan has been presented to and reviewed with the patient, Hoover BrunetteKimberly T Demeritt.  The patient and family have been given the opportunity to ask questions and make suggestions.  Juliann ParesBowman, Mya Suell Elizabeth, RN 09/05/2017, 5:00 AM

## 2017-09-05 NOTE — ED Notes (Signed)
ED TO INPATIENT HANDOFF REPORT  Name/Age/Gender Adrienne Crawford 27 y.o. female  Code Status    Code Status Orders  (From admission, onward)        Start     Ordered   09/04/17 1747  Full code  Continuous     09/04/17 1746    Code Status History    Date Active Date Inactive Code Status Order ID Comments User Context   01/27/2013 22:22 01/30/2013 17:26 Full Code [id number]  Tawnya Crook, CNM Inpatient      Home/SNF/Other Home  Chief Complaint Suicidal   Level of Care/Admitting Diagnosis ED Disposition    None      Medical History Past Medical History:  Diagnosis Date  . Anemia 02/07/2013  . Anxiety   . Asthma   . Depression   . Panic attack   . PID (acute pelvic inflammatory disease)   . Pregnant   . UTI (urinary tract infection)     Allergies Allergies  Allergen Reactions  . Sulfa Antibiotics Anaphylaxis and Swelling    IV Location/Drains/Wounds Patient Lines/Drains/Airways Status   Active Line/Drains/Airways    None          Labs/Imaging Results for orders placed or performed during the hospital encounter of 09/04/17 (from the past 48 hour(s))  Urine rapid drug screen (hosp performed)     Status: Abnormal   Collection Time: 09/04/17  6:10 PM  Result Value Ref Range   Opiates NONE DETECTED NONE DETECTED   Cocaine POSITIVE (A) NONE DETECTED   Benzodiazepines POSITIVE (A) NONE DETECTED   Amphetamines POSITIVE (A) NONE DETECTED   Tetrahydrocannabinol POSITIVE (A) NONE DETECTED   Barbiturates NONE DETECTED NONE DETECTED    Comment: (NOTE) DRUG SCREEN FOR MEDICAL PURPOSES ONLY.  IF CONFIRMATION IS NEEDED FOR ANY PURPOSE, NOTIFY LAB WITHIN 5 DAYS. LOWEST DETECTABLE LIMITS FOR URINE DRUG SCREEN Drug Class                     Cutoff (ng/mL) Amphetamine and metabolites    1000 Barbiturate and metabolites    200 Benzodiazepine                 426 Tricyclics and metabolites     300 Opiates and metabolites        300 Cocaine and  metabolites        300 THC                            50   Comprehensive metabolic panel     Status: None   Collection Time: 09/04/17  6:21 PM  Result Value Ref Range   Sodium 137 135 - 145 mmol/L   Potassium 4.4 3.5 - 5.1 mmol/L   Chloride 102 101 - 111 mmol/L   CO2 27 22 - 32 mmol/L   Glucose, Bld 80 65 - 99 mg/dL   BUN 13 6 - 20 mg/dL   Creatinine, Ser 0.96 0.44 - 1.00 mg/dL   Calcium 9.8 8.9 - 10.3 mg/dL   Total Protein 7.8 6.5 - 8.1 g/dL   Albumin 4.4 3.5 - 5.0 g/dL   AST 36 15 - 41 U/L   ALT 24 14 - 54 U/L   Alkaline Phosphatase 63 38 - 126 U/L   Total Bilirubin 1.0 0.3 - 1.2 mg/dL   GFR calc non Af Amer >60 >60 mL/min   GFR calc Af Amer >60 >60 mL/min  Comment: (NOTE) The eGFR has been calculated using the CKD EPI equation. This calculation has not been validated in all clinical situations. eGFR's persistently <60 mL/min signify possible Chronic Kidney Disease.    Anion gap 8 5 - 15  Ethanol     Status: None   Collection Time: 09/04/17  6:21 PM  Result Value Ref Range   Alcohol, Ethyl (B) <10 <10 mg/dL    Comment:        LOWEST DETECTABLE LIMIT FOR SERUM ALCOHOL IS 10 mg/dL FOR MEDICAL PURPOSES ONLY   CBC WITH DIFFERENTIAL     Status: Abnormal   Collection Time: 09/04/17  6:21 PM  Result Value Ref Range   WBC 5.6 4.0 - 10.5 K/uL   RBC 4.67 3.87 - 5.11 MIL/uL   Hemoglobin 16.7 (H) 12.0 - 15.0 g/dL   HCT 46.5 (H) 36.0 - 46.0 %   MCV 99.6 78.0 - 100.0 fL   MCH 35.8 (H) 26.0 - 34.0 pg   MCHC 35.9 30.0 - 36.0 g/dL   RDW 13.2 11.5 - 15.5 %   Platelets 130 (L) 150 - 400 K/uL   Neutrophils Relative % 52 %   Neutro Abs 2.9 1.7 - 7.7 K/uL   Lymphocytes Relative 34 %   Lymphs Abs 1.9 0.7 - 4.0 K/uL   Monocytes Relative 12 %   Monocytes Absolute 0.6 0.1 - 1.0 K/uL   Eosinophils Relative 2 %   Eosinophils Absolute 0.1 0.0 - 0.7 K/uL   Basophils Relative 0 %   Basophils Absolute 0.0 0.0 - 0.1 K/uL  Salicylate level     Status: None   Collection Time: 09/04/17   6:21 PM  Result Value Ref Range   Salicylate Lvl <1.1 2.8 - 30.0 mg/dL  Acetaminophen level     Status: Abnormal   Collection Time: 09/04/17  6:21 PM  Result Value Ref Range   Acetaminophen (Tylenol), Serum <10 (L) 10 - 30 ug/mL    Comment:        THERAPEUTIC CONCENTRATIONS VARY SIGNIFICANTLY. A RANGE OF 10-30 ug/mL MAY BE AN EFFECTIVE CONCENTRATION FOR MANY PATIENTS. HOWEVER, SOME ARE BEST TREATED AT CONCENTRATIONS OUTSIDE THIS RANGE. ACETAMINOPHEN CONCENTRATIONS >150 ug/mL AT 4 HOURS AFTER INGESTION AND >50 ug/mL AT 12 HOURS AFTER INGESTION ARE OFTEN ASSOCIATED WITH TOXIC REACTIONS.   I-Stat beta hCG blood, ED     Status: None   Collection Time: 09/04/17  6:37 PM  Result Value Ref Range   I-stat hCG, quantitative <5.0 <5 mIU/mL   Comment 3            Comment:   GEST. AGE      CONC.  (mIU/mL)   <=1 WEEK        5 - 50     2 WEEKS       50 - 500     3 WEEKS       100 - 10,000     4 WEEKS     1,000 - 30,000        FEMALE AND NON-PREGNANT FEMALE:     LESS THAN 5 mIU/mL    No results found.  Pending Labs Unresulted Labs (From admission, onward)   None      Vitals/Pain Today's Vitals   09/04/17 1954 09/04/17 2101 09/04/17 2340 09/05/17 0234  BP:  119/67 130/80 111/65  Pulse:  88 80 80  Resp:  16 14 14   Temp:  98.3 F (36.8 C) 98.3 F (36.8 C) 98.2 F (36.8  C)  TempSrc:  Oral Oral Oral  SpO2:  100% 96% 99%  Weight:      Height:      PainSc: 8        Isolation Precautions No active isolations  Medications Medications  LORazepam (ATIVAN) injection 0-4 mg ( Intravenous See Alternative 09/04/17 1844)    Or  LORazepam (ATIVAN) tablet 0-4 mg (1 mg Oral Given 09/04/17 1844)  LORazepam (ATIVAN) injection 0-4 mg (not administered)    Or  LORazepam (ATIVAN) tablet 0-4 mg (not administered)  thiamine (VITAMIN B-1) tablet 100 mg (100 mg Oral Given 09/04/17 1842)    Or  thiamine (B-1) injection 100 mg ( Intravenous See Alternative 09/04/17 1842)  acetaminophen  (TYLENOL) tablet 650 mg (650 mg Oral Given 09/04/17 1946)  bacitracin 500 UNIT/GM ointment (1 application  Given 08/25/10 1946)    Mobility walks
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
## 2017-09-05 NOTE — [facility] Group Notes (Signed)
LCSW Group Therapy Note   09/05/2017 1:15pm   Type of Therapy and Topic:  Group Therapy:  Overcoming Obstacles   Participation Level:  Did Not Attend-pt invited. Chose to remain in bed.    Description of Group:    In this group patients will be encouraged to explore what they see as obstacles to their own wellness and recovery. They will be guided to discuss their thoughts, feelings, and behaviors related to these obstacles. The group will process together ways to cope with barriers, with attention given to specific choices patients can make. Each patient will be challenged to identify changes they are motivated to make in order to overcome their obstacles. This group will be process-oriented, with patients participating in exploration of their own experiences as well as giving and receiving support and challenge from other group members.   Therapeutic Goals: 1. Patient will identify personal and current obstacles as they relate to admission. 2. Patient will identify barriers that currently interfere with their wellness or overcoming obstacles.  3. Patient will identify feelings, thought process and behaviors related to these barriers. 4. Patient will identify two changes they are willing to make to overcome these obstacles:      Summary of Patient Progress   x   Therapeutic Modalities:   Cognitive Behavioral Therapy Solution Focused Therapy Motivational Interviewing Relapse Prevention Therapy  Adrienne Alanis N Smart, LCSW 09/05/2017 1:06 PM  
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
## 2017-09-05 NOTE — Progress Notes (Signed)
D   Pt is anxious and hyperverbal   She has pressured speech and flight of ideas   She reports  Having been here for a few days and said she keeps asking for tests for STD but havent gotten it yet   Pt just got here this morning   Pt eye contact is poor and she puts her hand over her mouth when she is talking  A   Verbal support given   Medications administered and effectiveness monitored    Q 15 min checks  R   Pt is safe and contracts for safety and receptive to verbal support
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
## 2017-09-05 NOTE — [facility] Suicide Risk Assessment (Signed)
[facility] INPATIENT:  Family/Significant Other Suicide Prevention Education  Suicide Prevention Education:  Patient Refusal for Family/Significant Other Suicide Prevention Education: The patient Adrienne Crawford has refused to provide written consent for family/significant other to be provided Family/Significant Other Suicide Prevention Education during admission and/or prior to discharge.  Physician notified.  SPE completed with pt, as pt refused to consent to family contact. SPI pamphlet provided to pt and pt was encouraged to share information with support network, ask questions, and talk about any concerns relating to SPE. Pt denies access to guns/firearms and verbalized understanding of information provided. Mobile Crisis information also provided to pt.   Dakiya Puopolo N Smart LCSW 09/05/2017, 11:37 AM

## 2017-09-05 NOTE — Progress Notes (Signed)
[facility] Pt is able to be transferred immediately. Attending provider will be Dr. Jama Flavorsobos, MD. Call to report [phone]. TCU nurse notified of acceptance. [facility]  Adrienne Crawford, MSW, LCSW Therapeutic Triage Specialist  [phone]

## 2017-09-05 NOTE — Progress Notes (Signed)
Per pt request, CSW called her probation officer, Officer Brewer at [phone] to notify him of her hospitalization, as she was scheduled to meet with him today at 10:00AM. He asked that she call at discharge to let him know where she will be going and when (if she goes to treatment). Per pt request, CSW left message for Danna HeftyKristy Marlow, pt's DSS worker at [phone] requesting call back at her earliest convenience. Pt notified of above and provided with contact information to follow-up.  Trula SladeHeather Smart, MSW, LCSW Clinical Social Worker 09/05/2017 11:12 AM
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
## 2017-09-05 NOTE — Progress Notes (Signed)
Nutrition Brief Note  Patient identified on the Malnutrition Screening Tool (MST) Report  Wt Readings from Last 15 Encounters:  09/05/17 174 lb (78.9 kg)  09/04/17 175 lb (79.4 kg)  04/06/17 168 lb (76.2 kg)  01/11/17 171 lb (77.6 kg)  11/08/16 165 lb (74.8 kg)  11/04/16 165 lb (74.8 kg)  02/20/16 164 lb (74.4 kg)  08/28/14 169 lb (76.7 kg)  04/29/14 155 lb 9 oz (70.6 kg)  09/03/13 143 lb (64.9 kg)  03/12/13 158 lb (71.7 kg)  02/07/13 163 lb (73.9 kg)  01/29/13 188 lb (85.3 kg)  01/26/13 191 lb 3.2 oz (86.7 kg)  01/17/13 189 lb (85.7 kg)    Body mass index is 28.08 kg/m. Patient meets criteria for overweight status based on current BMI. Pt admitted for SI and substance abuse. She drinks 12+ beers/day and uses cocaine and THC.   Current diet order is Regular and patient is eating as desired for meals and snacks. Labs and medications reviewed.   Ensure Enlive ordered BID per ONS protocol at time of admission; will d/c order at this time. No nutrition interventions warranted at this time. If nutrition issues arise, please consult RD.      Trenton GammonJessica Rollyn Scialdone, MS, RD, LDN, [facility] Inpatient Clinical Dietitian Pager # [phone] After hours/weekend pager # [phone]

## 2017-09-05 NOTE — Tx Team (Signed)
Interdisciplinary Treatment and Diagnostic Plan Update  09/05/2017 Time of Session: 0830AM Adrienne Crawford Luzader MRN: [id number]  Principal Diagnosis: MDD, recurrent, severe  Secondary Diagnoses: Active Problems:   Severe recurrent major depression without psychotic features (HCC)   Current Medications:  Current Facility-Administered Medications  Medication Dose Route Frequency Provider Last Rate Last Dose  . acetaminophen (TYLENOL) tablet 650 mg  650 mg Oral Q6H PRN Nira ConnBerry, Jason A, NP      . alum & mag hydroxide-simeth (MAALOX/MYLANTA) 200-200-20 MG/5ML suspension 30 mL  30 mL Oral Q4H PRN Nira ConnBerry, Jason A, NP      . feeding supplement (ENSURE ENLIVE) (ENSURE ENLIVE) liquid 237 mL  237 mL Oral BID BM Cobos, Fernando A, MD      . hydrOXYzine (ATARAX/VISTARIL) tablet 25 mg  25 mg Oral Q6H PRN Nira ConnBerry, Jason A, NP   25 mg at 09/05/17 0412  . ibuprofen (ADVIL,MOTRIN) tablet 600 mg  600 mg Oral Q6H PRN Nira ConnBerry, Jason A, NP   600 mg at 09/05/17 0412  . loperamide (IMODIUM) capsule 2-4 mg  2-4 mg Oral PRN Nira ConnBerry, Jason A, NP      . LORazepam (ATIVAN) tablet 1 mg  1 mg Oral Q6H PRN Nira ConnBerry, Jason A, NP   1 mg at 09/05/17 0412  . LORazepam (ATIVAN) tablet 1 mg  1 mg Oral QID Jackelyn PolingBerry, Jason A, NP       Followed by  . [START ON 09/06/2017] LORazepam (ATIVAN) tablet 1 mg  1 mg Oral TID Jackelyn PolingBerry, Jason A, NP       Followed by  . [START ON 09/07/2017] LORazepam (ATIVAN) tablet 1 mg  1 mg Oral BID Jackelyn PolingBerry, Jason A, NP       Followed by  . [START ON 09/09/2017] LORazepam (ATIVAN) tablet 1 mg  1 mg Oral Daily Nira ConnBerry, Jason A, NP      . magnesium hydroxide (MILK OF MAGNESIA) suspension 30 mL  30 mL Oral Daily PRN Nira ConnBerry, Jason A, NP      . neomycin-bacitracin-polymyxin (NEOSPORIN) ointment   Topical PRN Nira ConnBerry, Jason A, NP      . nicotine (NICODERM CQ - dosed in mg/24 hours) patch 21 mg  21 mg Transdermal Daily Nira ConnBerry, Jason A, NP      . ondansetron (ZOFRAN-ODT) disintegrating tablet 4 mg  4 mg Oral Q6H PRN Nira ConnBerry, Jason A, NP    4 mg at 09/05/17 0413  . traZODone (DESYREL) tablet 50 mg  50 mg Oral QHS PRN Jackelyn PolingBerry, Jason A, NP       PTA Medications: Medications Prior to Admission  Medication Sig Dispense Refill Last Dose  . acetaminophen (TYLENOL) 325 MG tablet Take 650 mg by mouth every 6 (six) hours as needed for pain.   09/04/2017 at Unknown time  . albuterol (PROVENTIL HFA;VENTOLIN HFA) 108 (90 Base) MCG/ACT inhaler Inhale 1-2 puffs into the lungs every 6 (six) hours as needed for wheezing or shortness of breath.   Past Month at Unknown time  . cephALEXin (KEFLEX) 500 MG capsule Take 1 capsule (500 mg total) by mouth 3 (three) times daily. (Patient not taking: Reported on 09/04/2017) 21 capsule 0 Not Taking at Unknown time  . cyclobenzaprine (FLEXERIL) 5 MG tablet Take 1 tablet (5 mg total) by mouth 3 (three) times daily as needed. (Patient not taking: Reported on 09/04/2017) 30 tablet 0 Not Taking at Unknown time  . etonogestrel (NEXPLANON) 68 MG IMPL implant 68 mg by Implant route once.   unknown  . ibuprofen (  ADVIL,MOTRIN) 600 MG tablet Take 1 tablet (600 mg total) by mouth every 6 (six) hours as needed. 30 tablet 0 09/03/2017 at Unknown time  . naproxen (NAPROSYN) 500 MG tablet Take 1 po BID with food prn pain (Patient not taking: Reported on 09/04/2017) 30 tablet 0 Not Taking at Unknown time    Patient Stressors: Financial difficulties Legal issue Marital or family conflict Substance abuse Traumatic event  Patient Strengths: Average or above average intelligence Motivation for treatment/growth Work skills  Treatment Modalities: Medication Management, Group therapy, Case management,  1 to 1 session with clinician, Psychoeducation, Recreational therapy.   Physician Treatment Plan for Primary Diagnosis: MDD, recurrent, severe  Medication Management: Evaluate patient's response, side effects, and tolerance of medication regimen.  Therapeutic Interventions: 1 to 1 sessions, Unit Group sessions and Medication  administration.  Evaluation of Outcomes: Progressing  Physician Treatment Plan for Secondary Diagnosis: Active Problems:   Severe recurrent major depression without psychotic features (HCC)   Medication Management: Evaluate patient's response, side effects, and tolerance of medication regimen.  Therapeutic Interventions: 1 to 1 sessions, Unit Group sessions and Medication administration.  Evaluation of Outcomes: Progressing   RN Treatment Plan for Primary Diagnosis:MDD, recurrent, severe Long Term Goal(s): Knowledge of disease and therapeutic regimen to maintain health will improve  Short Term Goals: Ability to remain free from injury will improve, Ability to disclose and discuss suicidal ideas and Ability to identify and develop effective coping behaviors will improve  Medication Management: RN will administer medications as ordered by provider, will assess and evaluate patient's response and provide education to patient for prescribed medication. RN will report any adverse and/or side effects to prescribing provider.  Therapeutic Interventions: 1 on 1 counseling sessions, Psychoeducation, Medication administration, Evaluate responses to treatment, Monitor vital signs and CBGs as ordered, Perform/monitor CIWA, COWS, AIMS and Fall Risk screenings as ordered, Perform wound care treatments as ordered.  Evaluation of Outcomes: Progressing   LCSW Treatment Plan for Primary Diagnosis: MDD, recurrent, severe Long Term Goal(s): Safe transition to appropriate next level of care at discharge, Engage patient in therapeutic group addressing interpersonal concerns.  Short Term Goals: Engage patient in aftercare planning with referrals and resources, Facilitate patient progression through stages of change regarding substance use diagnoses and concerns and Identify triggers associated with mental health/substance abuse issues  Therapeutic Interventions: Assess for all discharge needs, 1 to 1 time  with Social worker, Explore available resources and support systems, Assess for adequacy in community support network, Educate family and significant other(s) on suicide prevention, Complete Psychosocial Assessment, Interpersonal group therapy.  Evaluation of Outcomes: Progressing   Progress in Treatment: Attending groups: No. Participating in groups: No.  New to unit. Continuing to assess. Taking medication as prescribed: Yes. Toleration medication: Yes. Family/Significant other contact made: No, will contact:  pt's family member if patient consents to collateral contact.  Patient understands diagnosis: Yes. Discussing patient identified problems/goals with staff: Yes. Medical problems stabilized or resolved: Yes. Denies suicidal/homicidal ideation: Yes. Issues/concerns per patient self-inventory: No. Other: n/a   New problem(s) identified: No, Describe:  n/a  New Short Term/Long Term Goal(s): detox, medication management for mood stabilization, development of comprehensive mental wellness/sobriety plan; elimination of SI thoughts.   Discharge Plan or Barriers: CSW assessing for appropriate referrals.   Reason for Continuation of Hospitalization: Anxiety Depression Medication stabilization Suicidal ideation Withdrawal symptoms  Estimated Length of Stay: Wed, 09/07/17  Attendees: Patient: 09/05/2017 8:16 AM  Physician: Dr. Jama Flavors MD; Dr. Altamese [place] MD 09/05/2017 8:16 AM  Nursing: Christa RN; Erskine Squibb RN 09/05/2017 8:16 AM  RN Care Manager:x 09/05/2017 8:16 AM  Social Worker: Chartered loss adjuster, LCSW 09/05/2017 8:16 AM  Recreational Therapist: x 09/05/2017 8:16 AM  Other: Armandina Stammer NP; Feliz Beam Money NP 09/05/2017 8:16 AM  Other:  09/05/2017 8:16 AM  Other: 09/05/2017 8:16 AM    Scribe for Treatment Team: Ledell Peoples Smart, LCSW 09/05/2017 8:16 AM

## 2017-09-06 DIAGNOSIS — F1290 Cannabis use, unspecified, uncomplicated: Secondary | ICD-10-CM

## 2017-09-06 DIAGNOSIS — R45851 Suicidal ideations: Secondary | ICD-10-CM

## 2017-09-06 DIAGNOSIS — M549 Dorsalgia, unspecified: Secondary | ICD-10-CM

## 2017-09-06 DIAGNOSIS — G43909 Migraine, unspecified, not intractable, without status migrainosus: Secondary | ICD-10-CM

## 2017-09-06 DIAGNOSIS — F1490 Cocaine use, unspecified, uncomplicated: Secondary | ICD-10-CM

## 2017-09-06 LAB — LIPID PANEL
CHOLESTEROL: 135 mg/dL (ref 0–200)
HDL: 62 mg/dL (ref >40)
LDL Cholesterol: 26 mg/dL (ref 0–99)
Total CHOL/HDL Ratio: 2.2 RATIO
Triglycerides: 234 mg/dL — ABNORMAL HIGH (ref <150)
VLDL: 47 mg/dL — ABNORMAL HIGH (ref 0–40)

## 2017-09-06 LAB — HEMOGLOBIN A1C
HEMOGLOBIN A1C: 4.9 % (ref 4.8–5.6)
MEAN PLASMA GLUCOSE: 93.93 mg/dL

## 2017-09-06 LAB — TSH: TSH: 1.12 u[IU]/mL (ref 0.350–4.500)

## 2017-09-06 NOTE — [facility] Group Notes (Signed)
[facility] Therapy 09/06/2017 1:15pm  Type of Therapy: Mental Health Association Presentation  Participation Level: Active  Participation Quality: Attentive  Affect: Appropriate  Cognitive: Oriented  Insight: Developing/Improving  Engagement in Therapy: Engaged  Modes of Intervention: Discussion, Education and Socialization  Summary of Progress/Problems: Mental Health Association (MHA) Speaker came to talk about his personal journey with mental health. The pt processed ways by which to relate to the speaker. MHA speaker provided handouts and educational information pertaining to groups and services offered by the [facility]. Pt was engaged in speaker's presentation and was receptive to resources provided.    Pulte HomesHeather N Smart, LCSW 09/06/2017 1:55 PM

## 2017-09-06 NOTE — Progress Notes (Signed)
Message left for [facility] admissions) to check status of this referral and bed availability.   Trula SladeHeather Smart, MSW, LCSW Clinical Social Worker 09/06/2017 9:50 AM

## 2017-09-06 NOTE — Progress Notes (Signed)
D   Pt is anxious and hyperverbal   She has pressured speech and flight of ideas   She reports increased pain in her back  Said it is cramping and she wants to go back on her flexiril that she took about a year ago  Pt  Seems less anxious and making better eye contact   A   Verbal support given   Medications administered and effectiveness monitored    Q 15 min checks   Encouraged pt to talk with her doctor about her pain and administered pain medications before bedtime R   Pt is safe and contracts for safety and receptive to verbal support

## 2017-09-06 NOTE — Progress Notes (Signed)
Recreation Therapy Notes  Patient Eligibility Criteria Checklist & Daily Group note for Rec TxIntervention  Date: 01.15.2019 Time: 2:45pm [address]   AAA/T Program Assumption of Risk Form signed by Patient/ or Parent Legal Guardian Yes  Patient is free of allergies or sever asthma Yes  Patient reports no fear of animals Yes  Patient reports no history of cruelty to animals Yes  Patient understands his/her participation is voluntary Yes  Behavioral Response: Did not attend.   Adrienne Crawford, LRT/CTRS         Jearl KlinefelterBlanchfield, Adrienne Crawford 09/06/2017 2:58 PM

## 2017-09-06 NOTE — Progress Notes (Signed)
[facility]. Pt notified of this and provided with information about this program. Pt made aware that this is likely her only option for residential treatment.  Trula SladeHeather Smart, MSW, LCSW Clinical Social Worker 09/06/2017 10:21 AM

## 2017-09-06 NOTE — Progress Notes (Signed)
[facility]  09/06/2017 4:02 PM Adrienne BrunetteKimberly T Crawford  MRN:  [id number]  Subjective: Adrienne BradfordKimberly reports, "I feel a little pissed off right now. My mood isn't good. I have been through a lot. I have migraine headaches, my back hurts. I feel like my boyfriend has given STD. My brother called & cursed me out. It has not been a good day today. I need a break, I can't talk right now".  Objective: Adrienne BradfordKimberly "Adrienne BattenKim" Adrienne LimerickCulkin is a 27 y/o F with history of MDD, PTSD, and polysubstance abuse who was admitted voluntarily with worsening depression, SI without plan, self-injurious behavior, and worsening illicit substance use.  Today, 09-06-14, patient remains in a bad mood. She is complaining of being in a bad mood. She has requested STD test. Denies any SIHI.  Principal Problem: Severe recurrent major depression without psychotic features (HCC)  Diagnosis:   Patient Active Problem List   Diagnosis Date Noted  . Polysubstance (excluding opioids) dependence, daily use (HCC) [F19.20] 09/05/2017    Priority: High  . Substance induced mood disorder (HCC) [F19.94] 09/05/2017    Priority: High  . Severe recurrent major depression without psychotic features (HCC) [F33.2] 09/05/2017  . PTSD (post-traumatic stress disorder) [F43.10] 09/05/2017  . Anemia [D64.9] 02/07/2013  . Depression [F32.9] 02/07/2013   Total Time spent with patient: 25 minutes  Past Psychiatric History: See H&P  Past Medical History:  Past Medical History:  Diagnosis Date  . Anemia 02/07/2013  . Anxiety   . Asthma   . Depression   . Panic attack   . PID (acute pelvic inflammatory disease)   . Pregnant   . UTI (urinary tract infection)     Past Surgical History:  Procedure Laterality Date  . BLADDER SURGERY     Family History:  Family History  Problem Relation Age of Onset  . Hypertension Paternal Grandfather   . Stroke Paternal Grandfather   . Cancer Maternal Grandmother   . Hypertension Maternal Grandmother   . Cancer  Maternal Grandfather   . Hypertension Father   . Stroke Father   . Cancer Mother   . Hypertension Mother   . Cancer Other        Lung, Throat annd Mouth   Family Psychiatric  History: See H&P Social History:  Social History   Substance and Sexual Activity  Alcohol Use Yes   Comment: occassional     Social History   Substance and Sexual Activity  Drug Use Yes  . Types: Marijuana, Cocaine   Comment: occasionally    Social History   Socioeconomic History  . Marital status: Single    Spouse name: None  . Number of children: None  . Years of education: None  . Highest education level: None  Social Needs  . Financial resource strain: None  . Food insecurity - worry: None  . Food insecurity - inability: None  . Transportation needs - medical: None  . Transportation needs - non-medical: None  Occupational History  . None  Tobacco Use  . Smoking status: Current Every Day Smoker    Packs/day: 4.00    Types: Cigarettes  . Smokeless tobacco: Never Used  . Tobacco comment: Quit smoking with positive preg. test.  Substance and Sexual Activity  . Alcohol use: Yes    Comment: occassional  . Drug use: Yes    Types: Marijuana, Cocaine    Comment: occasionally  . Sexual activity: Yes    Birth control/protection: None  Other Topics Concern  .  None  Social History Narrative  . None   Additional Social History:   Sleep: Good  Appetite:  Good  Current Medications: Current Facility-Administered Medications  Medication Dose Route Frequency Provider Last Rate Last Dose  . acetaminophen (TYLENOL) tablet 650 mg  650 mg Oral Q6H PRN Nira Conn A, NP   650 mg at 09/05/17 0844  . albuterol (PROVENTIL HFA;VENTOLIN HFA) 108 (90 Base) MCG/ACT inhaler 1-2 puff  1-2 puff Inhalation Q6H PRN Shanette Tamargo I, NP      . alum & mag hydroxide-simeth (MAALOX/MYLANTA) 200-200-20 MG/5ML suspension 30 mL  30 mL Oral Q4H PRN Nira Conn A, NP      . ARIPiprazole (ABILIFY) tablet 5 mg  5 mg  Oral Daily Shila Kruczek I, NP   5 mg at 09/06/17 0810  . chlordiazePOXIDE (LIBRIUM) capsule 25 mg  25 mg Oral Q6H PRN Bryse Blanchette I, NP      . chlordiazePOXIDE (LIBRIUM) capsule 25 mg  25 mg Oral TID Armandina Stammer I, NP   25 mg at 09/06/17 1200   Followed by  . [START ON 09/07/2017] chlordiazePOXIDE (LIBRIUM) capsule 25 mg  25 mg Oral BH-qamhs Armandina Stammer I, NP       Followed by  . [START ON 09/08/2017] chlordiazePOXIDE (LIBRIUM) capsule 25 mg  25 mg Oral Daily Rasmus Preusser I, NP      . gabapentin (NEURONTIN) capsule 300 mg  300 mg Oral TID Armandina Stammer I, NP   300 mg at 09/06/17 1200  . hydrOXYzine (ATARAX/VISTARIL) tablet 25 mg  25 mg Oral Q6H PRN Nira Conn A, NP   25 mg at 09/05/17 2124  . ibuprofen (ADVIL,MOTRIN) tablet 600 mg  600 mg Oral Q6H PRN Nira Conn A, NP   600 mg at 09/06/17 0814  . loperamide (IMODIUM) capsule 2-4 mg  2-4 mg Oral PRN Nira Conn A, NP      . magnesium hydroxide (MILK OF MAGNESIA) suspension 30 mL  30 mL Oral Daily PRN Nira Conn A, NP      . mirtazapine (REMERON) tablet 15 mg  15 mg Oral QHS Armandina Stammer I, NP   15 mg at 09/05/17 2123  . multivitamin with minerals tablet 1 tablet  1 tablet Oral Daily Armandina Stammer I, NP   1 tablet at 09/06/17 0810  . neomycin-bacitracin-polymyxin (NEOSPORIN) ointment   Topical PRN Nira Conn A, NP      . nicotine (NICODERM CQ - dosed in mg/24 hours) patch 21 mg  21 mg Transdermal Daily Nira Conn A, NP   21 mg at 09/06/17 0809  . ondansetron (ZOFRAN-ODT) disintegrating tablet 4 mg  4 mg Oral Q6H PRN Nira Conn A, NP   4 mg at 09/05/17 0413  . thiamine (VITAMIN B-1) tablet 100 mg  100 mg Oral Daily Armandina Stammer I, NP   100 mg at 09/06/17 0810  . traZODone (DESYREL) tablet 50 mg  50 mg Oral QHS PRN Jackelyn Poling, NP       Lab Results:  Results for orders placed or performed during the hospital encounter of 09/05/17 (from the past 48 hour(s))  Hemoglobin A1c     Status: None   Collection Time: 09/06/17  7:21 AM   Result Value Ref Range   Hgb A1c MFr Bld 4.9 4.8 - 5.6 %    Comment: (NOTE) Pre diabetes:          5.7%-6.4% Diabetes:              >  6.4% Glycemic control for   <7.0% adults with diabetes    Mean Plasma Glucose 93.93 mg/dL    Comment: Performed at [facility], [address] [place], [place] [address]  Lipid panel     Status: Abnormal   Collection Time: 09/06/17  7:21 AM  Result Value Ref Range   Cholesterol 135 0 - 200 mg/dL   Triglycerides 604 (H) <150 mg/dL   HDL 62 >54 mg/dL   Total CHOL/HDL Ratio 2.2 RATIO   VLDL 47 (H) 0 - 40 mg/dL   LDL Cholesterol 26 0 - 99 mg/dL    Comment:        Total Cholesterol/HDL:CHD Risk Coronary Heart Disease Risk Table                     Men   Women  1/2 Average Risk   3.4   3.3  Average Risk       5.0   4.4  2 X Average Risk   9.6   7.1  3 X Average Risk  23.4   11.0        Use the calculated Patient Ratio above and the CHD Risk Table to determine the patient's CHD Risk.        ATP III CLASSIFICATION (LDL):  <100     mg/dL   Optimal  098-119  mg/dL   Near or Above                    Optimal  130-159  mg/dL   Borderline  147-829  mg/dL   High  >562     mg/dL   Very High Performed at [facility], [address] [place], [place] [address]   TSH     Status: None   Collection Time: 09/06/17  7:21 AM  Result Value Ref Range   TSH 1.120 0.350 - 4.500 uIU/mL    Comment: Performed by a 3rd Generation assay with a functional sensitivity of <=0.01 uIU/mL. Performed at [facility], [address] [place], [place] [address]    Blood Alcohol level:  Lab Results  Component Value Date   ETH <10 09/04/2017   ETH 267 (H) 11/09/2016   Metabolic Disorder Labs: Lab Results  Component Value Date   HGBA1C 4.9 09/06/2017   MPG 93.93 09/06/2017   No results found for: PROLACTIN Lab Results  Component Value Date   CHOL 135 09/06/2017   TRIG 234 (H) 09/06/2017   HDL 62 09/06/2017   CHOLHDL  2.2 09/06/2017   VLDL 47 (H) 09/06/2017   LDLCALC 26 09/06/2017   Physical Findings: AIMS: Facial and Oral Movements Muscles of Facial Expression: None, normal Lips and Perioral Area: None, normal Jaw: None, normal Tongue: None, normal,Extremity Movements Upper (arms, wrists, hands, fingers): None, normal Lower (legs, knees, ankles, toes): None, normal, Trunk Movements Neck, shoulders, hips: None, normal, Overall Severity Severity of abnormal movements (highest score from questions above): None, normal Incapacitation due to abnormal movements: None, normal Patient's awareness of abnormal movements (rate only patient's report): No Awareness, Dental Status Current problems with teeth and/or dentures?: No Does patient usually wear dentures?: No  CIWA:  CIWA-Ar Total: 3 COWS:     Musculoskeletal: Strength & Muscle Tone: within normal limits Gait & Station: normal Patient leans: N/A  Psychiatric Specialty Exam: Physical Exam  Nursing note and vitals reviewed.   ROS  Blood pressure 114/82, pulse 95, temperature 97.9 F (36.6 C),  temperature source Oral, resp. rate 18, height 5\' 6"  (1.676 m), weight 78.9 kg (174 lb), last menstrual period 09/04/2017.Body mass index is 28.08 kg/m.  General Appearance: Casual and Fairly Groomed  Eye Contact:  Good  Speech:  Clear and Coherent and Normal Rate  Volume:  Normal  Mood:  Anxious and Depressed  Affect:  Congruent, Constricted and Depressed  Thought Process:  Coherent and Goal Directed  Orientation:  Full (Time, Place, and Person)  Thought Content:  Logical and Hallucinations: Visual  Suicidal Thoughts:  Yes.  without intent/plan  Homicidal Thoughts:  No  Memory:  Immediate;   Good Recent;   Good Remote;   Good  Judgement:  Impaired  Insight:  Lacking  Psychomotor Activity:  Normal  Concentration:  Concentration: Fair  Recall:  [employer] of Knowledge:  Fair  Language:  Fair  Akathisia:  No  Handed:    AIMS (if indicated):      Assets:  Communication Skills Leisure Time Physical Health Resilience  ADL's:  Intact  Cognition:  WNL  Sleep: 6.75       Treatment Plan Summary: Daily contact with patient to assess and evaluate symptoms and progress in treatment. Patient continues to require inpatient hospitalization.  Will continue today 09/06/2017 plan as below except where it is noted.  Benzodiazepine detox.     - Continue the Librium detox protocols.  Mood control.     - Continue Abilify 5 mg po daily.  Agitation.     - Continue Gabapentin 300 mg po tid.  Depression/insomnia.     - Continue Mirtazapine 15 mg po Q hs.  Insomnia.     - Continue Trazodone 50 mg po Q hs.  Obtain STD panel per patient's request. Patient to participate in the group sessions. SW to continue to work on the discharge disposition.  Armandina Stammer, NP, PMHNP, FNP-BC 09/06/2017, 4:02 PM

## 2017-09-06 NOTE — Progress Notes (Signed)
Adult Psychoeducational Group Note  Date:  09/06/2017 Time:  12:16 AM  Group Topic/Focus:  Wrap-Up Group:   The focus of this group is to help patients review their daily goal of treatment and discuss progress on daily workbooks.  Participation Level:  Did Not Attend  Participation Quality:  Did not attend  Affect:  Did not attend  Cognitive:  Did not attend  Insight: None  Engagement in Group:  Did not attend  Modes of Intervention:  Did not attend  Additional Comments:  Pt did not attend evening wrap up group tonight.  Adrienne FurnaceChristopher  [name] 09/06/2017, 12:16 AM

## 2017-09-07 LAB — RPR: RPR Ser Ql: NONREACTIVE

## 2017-09-07 LAB — HIV ANTIBODY (ROUTINE TESTING W REFLEX): HIV SCREEN 4TH GENERATION: NONREACTIVE

## 2017-09-07 LAB — PROLACTIN: PROLACTIN: 30.1 ng/mL — AB (ref 4.8–23.3)

## 2017-09-07 LAB — GC/CHLAMYDIA PROBE AMP (~~LOC~~) NOT AT ARMC
Chlamydia: NEGATIVE
NEISSERIA GONORRHEA: POSITIVE — AB

## 2017-09-07 MED ORDER — PRAZOSIN HCL 1 MG PO CAPS
1.0000 mg | ORAL_CAPSULE | Freq: Every day | ORAL | Status: DC
  Administered 2017-09-07: 1 mg via ORAL
  Filled 2017-09-07 (×4): qty 1

## 2017-09-07 MED ORDER — ASPIRIN-ACETAMINOPHEN-CAFFEINE 250-250-65 MG PO TABS: 1.0000 | ORAL_TABLET | Freq: Three times a day (TID) | ORAL | Status: DC | PRN

## 2017-09-07 NOTE — Progress Notes (Signed)
D   Pt is anxious and hyperverbal   She has pressured speech and flight of ideas   She reports increased pain in her back  Complains of nightmares and received new medication to help prevent them  Pt  Seems less anxious and making better eye contact   A   Verbal support given   Medications administered and effectiveness monitored    Q 15 min checks   Encouraged pt to talk with her doctor about her pain and administered pain medications before bedtime R   Pt is safe and contracts for safety and receptive to verbal support

## 2017-09-07 NOTE — [facility] Group Notes (Addendum)
Daily Orientation  Date:  09/07/2017  Time:  8:50 AM  Type of Therapy:  Nurse Education  /  The group focuses on orienting  Patients to the daily programming schedule, explaining  Unit flow and establishing therapeutic relationship.  Participation Level:  Active  Participation Quality:  Attentive  Affect:  Appropriate  Cognitive:  Alert  Insight:  Good  Engagement in Group:  Engaged  Modes of Intervention:  Education  Summary of Progress/Problems:  Adrienne Crawford, Adrienne Crawford Lynn 09/07/2017, 8:50 AM

## 2017-09-07 NOTE — Progress Notes (Signed)
[facility] MD Progress Note  09/07/2017 3:33 PM AVELYN TOUCH  MRN:  [id number]  Subjective: Ahnna reports, "I I've got back problems, a lot of muscle spasms. I got Migraine headaches. I don't feel good. I can't eat because I'm nauseated. I have been feeling aggravated. Can I have flexeril?"  Objective: Jenessa "Selena Batten" Caudle is a 27 y/o F with history of MDD, PTSD, and polysubstance abuse who was admitted voluntarily with worsening depression, SI without plan, self-injurious behavior, and worsening illicit substance use.  Today, 09-07-14, Patient remains in a bad mood. She is complaining of having migraine headaches, back problems, nausea & not able to eat for fear of throwing up. She complained of nightmares. She had requested STD test yesterday, result pending. Denies any SIHI. [place] now has an order for Excedrin Migraine, Zofran 4 mg, Ibuprofen 600 mg & Minipress 1mg . She is encouraged to come of bed, walk around, participate in the group sessions. She continues to require support.  Principal Problem: Severe recurrent major depression without psychotic features (HCC)  Diagnosis:   Patient Active Problem List   Diagnosis Date Noted  . Polysubstance (excluding opioids) dependence, daily use (HCC) [F19.20] 09/05/2017    Priority: High  . Substance induced mood disorder (HCC) [F19.94] 09/05/2017    Priority: High  . Severe recurrent major depression without psychotic features (HCC) [F33.2] 09/05/2017  . PTSD (post-traumatic stress disorder) [F43.10] 09/05/2017  . Anemia [D64.9] 02/07/2013  . Depression [F32.9] 02/07/2013   Total Time spent with patient: 25 minutes  Past Psychiatric History: See H&P  Past Medical History:  Past Medical History:  Diagnosis Date  . Anemia 02/07/2013  . Anxiety   . Asthma   . Depression   . Panic attack   . PID (acute pelvic inflammatory disease)   . Pregnant   . UTI (urinary tract infection)     Past Surgical History:  Procedure Laterality Date   . BLADDER SURGERY     Family History:  Family History  Problem Relation Age of Onset  . Hypertension Paternal Grandfather   . Stroke Paternal Grandfather   . Cancer Maternal Grandmother   . Hypertension Maternal Grandmother   . Cancer Maternal Grandfather   . Hypertension Father   . Stroke Father   . Cancer Mother   . Hypertension Mother   . Cancer Other        Lung, Throat annd Mouth   Family Psychiatric  History: See H&P Social History:  Social History   Substance and Sexual Activity  Alcohol Use Yes   Comment: occassional     Social History   Substance and Sexual Activity  Drug Use Yes  . Types: Marijuana, Cocaine   Comment: occasionally    Social History   Socioeconomic History  . Marital status: Single    Spouse name: None  . Number of children: None  . Years of education: None  . Highest education level: None  Social Needs  . Financial resource strain: None  . Food insecurity - worry: None  . Food insecurity - inability: None  . Transportation needs - medical: None  . Transportation needs - non-medical: None  Occupational History  . None  Tobacco Use  . Smoking status: Current Every Day Smoker    Packs/day: 4.00    Types: Cigarettes  . Smokeless tobacco: Never Used  . Tobacco comment: Quit smoking with positive preg. test.  Substance and Sexual Activity  . Alcohol use: Yes    Comment: occassional  . Drug  use: Yes    Types: Marijuana, Cocaine    Comment: occasionally  . Sexual activity: Yes    Birth control/protection: None  Other Topics Concern  . None  Social History Narrative  . None   Additional Social History:   Sleep: Good  Appetite:  Good  Current Medications: Current Facility-Administered Medications  Medication Dose Route Frequency Provider Last Rate Last Dose  . acetaminophen (TYLENOL) tablet 650 mg  650 mg Oral Q6H PRN Nira Conn A, NP   650 mg at 09/07/17 0837  . albuterol (PROVENTIL HFA;VENTOLIN HFA) 108 (90 Base)  MCG/ACT inhaler 1-2 puff  1-2 puff Inhalation Q6H PRN Armandina Stammer I, NP   2 puff at 09/07/17 1413  . alum & mag hydroxide-simeth (MAALOX/MYLANTA) 200-200-20 MG/5ML suspension 30 mL  30 mL Oral Q4H PRN Nira Conn A, NP      . ARIPiprazole (ABILIFY) tablet 5 mg  5 mg Oral Daily Armandina Stammer I, NP   5 mg at 09/07/17 0837  . aspirin-acetaminophen-caffeine (EXCEDRIN MIGRAINE) per tablet 1 tablet  1 tablet Oral Q8H PRN Armandina Stammer I, NP      . chlordiazePOXIDE (LIBRIUM) capsule 25 mg  25 mg Oral Q6H PRN Armandina Stammer I, NP   25 mg at 09/06/17 2123  . chlordiazePOXIDE (LIBRIUM) capsule 25 mg  25 mg Oral BH-qamhs Brayan Votaw, Nicole Kindred I, NP   25 mg at 09/07/17 1610   Followed by  . [START ON 09/08/2017] chlordiazePOXIDE (LIBRIUM) capsule 25 mg  25 mg Oral Daily Lisa-Marie Rueger I, NP      . gabapentin (NEURONTIN) capsule 300 mg  300 mg Oral TID Armandina Stammer I, NP   300 mg at 09/07/17 1327  . hydrOXYzine (ATARAX/VISTARIL) tablet 25 mg  25 mg Oral Q6H PRN Nira Conn A, NP   25 mg at 09/05/17 2124  . ibuprofen (ADVIL,MOTRIN) tablet 600 mg  600 mg Oral Q6H PRN Nira Conn A, NP   600 mg at 09/06/17 2123  . loperamide (IMODIUM) capsule 2-4 mg  2-4 mg Oral PRN Nira Conn A, NP      . magnesium hydroxide (MILK OF MAGNESIA) suspension 30 mL  30 mL Oral Daily PRN Nira Conn A, NP      . mirtazapine (REMERON) tablet 15 mg  15 mg Oral QHS Armandina Stammer I, NP   15 mg at 09/06/17 2123  . multivitamin with minerals tablet 1 tablet  1 tablet Oral Daily Armandina Stammer I, NP   1 tablet at 09/07/17 9604  . neomycin-bacitracin-polymyxin (NEOSPORIN) ointment   Topical PRN Nira Conn A, NP      . nicotine (NICODERM CQ - dosed in mg/24 hours) patch 21 mg  21 mg Transdermal Daily Nira Conn A, NP   21 mg at 09/07/17 0839  . ondansetron (ZOFRAN-ODT) disintegrating tablet 4 mg  4 mg Oral Q6H PRN Nira Conn A, NP   4 mg at 09/05/17 0413  . prazosin (MINIPRESS) capsule 1 mg  1 mg Oral QHS Georgian Mcclory I, NP      . thiamine (VITAMIN  B-1) tablet 100 mg  100 mg Oral Daily Nikan Ellingson I, NP   100 mg at 09/07/17 0837  . traZODone (DESYREL) tablet 50 mg  50 mg Oral QHS PRN Jackelyn Poling, NP   50 mg at 09/06/17 2123   Lab Results:  Results for orders placed or performed during the hospital encounter of 09/05/17 (from the past 48 hour(s))  Prolactin     Status: Abnormal  Collection Time: 09/06/17  7:21 AM  Result Value Ref Range   Prolactin 30.1 (H) 4.8 - 23.3 ng/mL    Comment: (NOTE) Performed At: [facility] [address] [place], [place] [id number] Jolene SchimkeNagendra Sanjai MD WU:[phone] Performed at [facility], [address] [place], [place] [address]   Hemoglobin A1c     Status: None   Collection Time: 09/06/17  7:21 AM  Result Value Ref Range   Hgb A1c MFr Bld 4.9 4.8 - 5.6 %    Comment: (NOTE) Pre diabetes:          5.7%-6.4% Diabetes:              >6.4% Glycemic control for   <7.0% adults with diabetes    Mean Plasma Glucose 93.93 mg/dL    Comment: Performed at [facility], [address] [place], [place] [address]  Lipid panel     Status: Abnormal   Collection Time: 09/06/17  7:21 AM  Result Value Ref Range   Cholesterol 135 0 - 200 mg/dL   Triglycerides 784234 (H) <150 mg/dL   HDL 62 >69>40 mg/dL   Total CHOL/HDL Ratio 2.2 RATIO   VLDL 47 (H) 0 - 40 mg/dL   LDL Cholesterol 26 0 - 99 mg/dL    Comment:        Total Cholesterol/HDL:CHD Risk Coronary Heart Disease Risk Table                     Men   Women  1/2 Average Risk   3.4   3.3  Average Risk       5.0   4.4  2 X Average Risk   9.6   7.1  3 X Average Risk  23.4   11.0        Use the calculated Patient Ratio above and the CHD Risk Table to determine the patient's CHD Risk.        ATP III CLASSIFICATION (LDL):  <100     mg/dL   Optimal  [id number]  mg/dL   Near or Above                    Optimal  130-159  mg/dL   Borderline  [id number]  mg/dL   High  >010>190     mg/dL   Very High Performed at [facility], [address] [place], [place] [address]   TSH     Status: None   Collection Time: 09/06/17  7:21 AM  Result Value Ref Range   TSH 1.120 0.350 - 4.500 uIU/mL    Comment: Performed by a 3rd Generation assay with a functional sensitivity of <=0.01 uIU/mL. Performed at [facility], [address] [place], [place] [address]   HIV antibody (routine testing) (NOT for [facility])     Status: None   Collection Time: 09/06/17  6:23 PM  Result Value Ref Range   HIV Screen 4th Generation wRfx Non Reactive Non Reactive    Comment: (NOTE) Performed At: [facility] [address] [place], [place] [id number] Jolene SchimkeNagendra Sanjai MD LO:[phone] Performed at [facility], [address] [place], [place] [address]   RPR     Status: None   Collection Time: 09/06/17  6:23 PM  Result Value Ref Range   RPR Ser Ql Non Reactive Non Reactive    Comment: (NOTE) Performed At: [facility]  [address] [place], [place] [id number]  Jolene Schimke MD ZO:[id number] Performed at [facility], [address], [place], [place] [address]    Blood Alcohol level:  Lab Results  Component Value Date   ETH <10 09/04/2017   ETH 267 (H) 11/09/2016   Metabolic Disorder Labs: Lab Results  Component Value Date   HGBA1C 4.9 09/06/2017   MPG 93.93 09/06/2017   Lab Results  Component Value Date   PROLACTIN 30.1 (H) 09/06/2017   Lab Results  Component Value Date   CHOL 135 09/06/2017   TRIG 234 (H) 09/06/2017   HDL 62 09/06/2017   CHOLHDL 2.2 09/06/2017   VLDL 47 (H) 09/06/2017   LDLCALC 26 09/06/2017   Physical Findings: AIMS: Facial and Oral Movements Muscles of Facial Expression: None, normal Lips and Perioral Area: None, normal Jaw: None, normal Tongue: None, normal,Extremity Movements Upper (arms, wrists, hands, fingers): None, normal Lower (legs, knees, ankles, toes): None, normal, Trunk Movements Neck,  shoulders, hips: None, normal, Overall Severity Severity of abnormal movements (highest score from questions above): None, normal Incapacitation due to abnormal movements: None, normal Patient's awareness of abnormal movements (rate only patient's report): No Awareness, Dental Status Current problems with teeth and/or dentures?: No Does patient usually wear dentures?: No  CIWA:  CIWA-Ar Total: 2 COWS:     Musculoskeletal: Strength & Muscle Tone: within normal limits Gait & Station: normal Patient leans: N/A  Psychiatric Specialty Exam: Physical Exam  Nursing note and vitals reviewed.   Review of Systems  Psychiatric/Behavioral: Positive for depression and substance abuse (Hx. polysubstance use disorder). Negative for hallucinations, memory loss and suicidal ideas. The patient is nervous/anxious. The patient does not have insomnia.     Blood pressure 115/61, pulse 85, temperature 98.7 F (37.1 C), temperature source Oral, resp. rate 16, height 5\' 6"  (1.676 m), weight 78.9 kg (174 lb), last menstrual period 09/04/2017.Body mass index is 28.08 kg/m.  General Appearance: Casual and Fairly Groomed  Eye Contact:  Good  Speech:  Clear and Coherent and Normal Rate  Volume:  Normal  Mood:  Anxious and Depressed  Affect:  Congruent, Constricted and Depressed  Thought Process:  Coherent and Goal Directed  Orientation:  Full (Time, Place, and Person)  Thought Content:  Logical and Hallucinations: Visual  Suicidal Thoughts:  Yes.  without intent/plan  Homicidal Thoughts:  No  Memory:  Immediate;   Good Recent;   Good Remote;   Good  Judgement:  Impaired  Insight:  Lacking  Psychomotor Activity:  Normal  Concentration:  Concentration: Fair  Recall:  [employer] of Knowledge:  Fair  Language:  Fair  Akathisia:  No  Handed:    AIMS (if indicated):     Assets:  Communication Skills Leisure Time Physical Health Resilience  ADL's:  Intact  Cognition:  WNL  Sleep: 6.75        Treatment Plan Summary: Daily contact with patient to assess and evaluate symptoms and progress in treatment. Patient continues to require inpatient hospitalization.  Will continue today 09/07/2017 plan as below except where it is noted.  Benzodiazepine detox.     - Continue the Librium detox protocols.  Mood control.     - Continue Abilify 5 mg po daily.  Agitation.     - Continue Gabapentin 300 mg po tid.  Depression/insomnia.     - Continue Mirtazapine 15 mg po Q hs.  Insomnia.     - Continue Trazodone 50 mg po Q hs.     - Initiated Minipress 1  mg daily for nightmares.      - Continue all other prn medication regimen for the other medical complaints.  Obtain STD panel per patient's request, result pending.  Patient to participate in the group sessions.  SW to continue to work on the discharge disposition.  Armandina Stammer, NP, PMHNP, FNP-BC 09/07/2017, 3:33 PMPatient ID: Hoover Brunette, female   DOB: July 08, 1991, 27 y.o.   MRN: [id number]

## 2017-09-07 NOTE — BHH Group Notes (Signed)
LCSW Group Therapy Note  09/07/2017 1:15pm  Type of Therapy/Topic:  Group Therapy:  Feelings about Diagnosis  Participation Level:  Active   Description of Group:   This group will allow patients to explore their thoughts and feelings about diagnoses they have received. Patients will be guided to explore their level of understanding and acceptance of these diagnoses. Facilitator will encourage patients to process their thoughts and feelings about the reactions of others to their diagnosis and will guide patients in identifying ways to discuss their diagnosis with significant others in their lives. This group will be process-oriented, with patients participating in exploration of their own experiences, giving and receiving support, and processing challenge from other group members.   Therapeutic Goals: 1. Patient will demonstrate understanding of diagnosis as evidenced by identifying two or more symptoms of the disorder 2. Patient will be able to express two feelings regarding the diagnosis 3. Patient will demonstrate their ability to communicate their needs through discussion and/or role play  Summary of Patient Progress:  [name] was attentive and engaged during today's processing group. She shared that she feels detached from society and misunderstood due to her mental health issues and substance abuse disorder. "Everyone tells me to just get over it and they don't get me." [name] continues to show progress in the group setting with improving insight. She is anxious to get accepted into [employer] and "leave here."   Therapeutic Modalities:   Cognitive Behavioral Therapy Brief Therapy Feelings Identification    Ledell PeoplesHeather N Smart, LCSW 09/07/2017 1:22 PM

## 2017-09-07 NOTE — Progress Notes (Signed)
DAR NOTE: Patient presents with anxious affect and depressed mood. Pt came to the med window complaining of back pain. Pt sated " Back is hurting so bad that I missed breakfast, nothing else work with my pain except Flexeril ." Pt also concerned about STD. Pt is convinced that something is not alright although the test results came back negative.  Pt has been observed in the dayroom interacting with peers without any problems. Denies auditory and visual hallucinations. Rates depression at 6, hopelessness at 8, and anxiety at 8.  Maintained on routine safety checks.  Medications given as prescribed.  Support and encouragement offered as needed. Patient observed socializing with peers in the dayroom. Will continue to monitor.

## 2017-09-07 NOTE — Progress Notes (Signed)
Recreation Therapy Notes  Date: 09/07/17 Time: 0930 Location: 300 Hall Dayroom  Group Topic: Stress Management  Goal Area(s) Addresses:  Patient will verbalize importance of using healthy stress management.  Patient will identify positive emotions associated with healthy stress management.   Intervention: Stress Management  Activity :  Meditation.  LRT introduced the stress management group of meditation.  LRT played Crawford meditation from the Calm app to participate in the meditation.  Patients were to listen and follow along with the meditation in order to engage in the activity.  Education:  Stress Management, Discharge Planning.   Education Outcome: Acknowledges edcuation/In group clarification offered/Needs additional education  Clinical Observations/Feedback: Pt did not attend group.    Darianne Muralles, LRT/CTRS         Adrienne Crawford 09/07/2017 12:39 PM 

## 2017-09-07 NOTE — Progress Notes (Signed)
Patient did not attend the evening speaker NA meeting. Pt was emotional and went to her room. She requested and received  medicine and food from the nurse.

## 2017-09-08 DIAGNOSIS — A549 Gonococcal infection, unspecified: Secondary | ICD-10-CM

## 2017-09-08 MED ORDER — TRAZODONE HCL 100 MG PO TABS
100.0000 mg | ORAL_TABLET | Freq: Every evening | ORAL | Status: DC | PRN
  Administered 2017-09-08: 100 mg via ORAL
  Filled 2017-09-08: qty 21
  Filled 2017-09-08: qty 1
  Filled 2017-09-08: qty 21

## 2017-09-08 MED ORDER — CEFTRIAXONE SODIUM 250 MG IJ SOLR
250.0000 mg | Freq: Once | INTRAMUSCULAR | Status: AC
  Administered 2017-09-08: 250 mg via INTRAMUSCULAR
  Filled 2017-09-08: qty 250

## 2017-09-08 MED ORDER — AZITHROMYCIN 500 MG PO TABS
1000.0000 mg | ORAL_TABLET | Freq: Once | ORAL | Status: AC
  Administered 2017-09-08: 1000 mg via ORAL
  Filled 2017-09-08: qty 2

## 2017-09-08 MED ORDER — AZITHROMYCIN 1 G PO PACK: 1.0000 g | PACK | Freq: Once | ORAL | Status: DC

## 2017-09-08 MED ORDER — HYDROXYZINE HCL 50 MG PO TABS
50.0000 mg | ORAL_TABLET | Freq: Four times a day (QID) | ORAL | Status: DC | PRN
  Administered 2017-09-08: 50 mg via ORAL
  Filled 2017-09-08: qty 1
  Filled 2017-09-08: qty 30

## 2017-09-08 MED ORDER — PRAZOSIN HCL 2 MG PO CAPS
2.0000 mg | ORAL_CAPSULE | Freq: Every day | ORAL | Status: DC
  Administered 2017-09-08: 2 mg via ORAL
  Filled 2017-09-08: qty 21
  Filled 2017-09-08 (×3): qty 1
  Filled 2017-09-08: qty 21

## 2017-09-08 MED ORDER — NICOTINE POLACRILEX 2 MG MT GUM
2.0000 mg | CHEWING_GUM | OROMUCOSAL | Status: DC | PRN
  Administered 2017-09-08 – 2017-09-09 (×2): 2 mg via ORAL
  Filled 2017-09-08: qty 1

## 2017-09-08 NOTE — BHH Group Notes (Signed)
Daily Orientation  Date:  09/08/2017  Time:  9:11 AM  Type of Therapy:  Nurse Education  /  The group focuses on orienting patients to the daily programming schedule on their hall, explaining staff responsibilities and establishing therapeutic relationship.  Participation Level:  Active  Participation Quality:  Attentive  Affect:  Appropriate  Cognitive:  Alert  Insight:  Appropriate  Engagement in Group:  Engaged  Modes of Intervention:  Education  Summary of Progress/Problems:  Rich BraveDuke, Tkeyah Burkman Lynn 09/08/2017, 9:11 AM

## 2017-09-08 NOTE — Progress Notes (Signed)
Adrienne Crawford  09/08/2017 3:16 PM Adrienne Crawford  MRN:  [id number] Subjective:    Adrienne Crawford is a 27 y/o F with history of MDD, PTSD, and polysubstance abuse who was admitted voluntarily with worsening depression, SI without plan, self-injurious behavior, and worsening illicit substance use. She was started on CIWA protocol as well started on combination of abilify and remeron. She was accepted to substance use treatment for 09/09/17 to [facility] (with alternative backup plan of discharging to [facility]).  Today upon evaluation, pt reports she is doing "okay." She shares that her anxiety has still been problematic, and it happens episodically. Pt shares, "I feel like I'm still having panic attacks - like last night, once of the other patients left, and I felt like I couldn't take it." Discussed with patient that we would restart atarax at higher dose for her to utilize as an as-needed medication, and pt was in agreement. She shares that she also had nightmares last night, and she agreed to increase dose of prazosin tonight. She denies SI/HI/AH/VH. She is tolerating her other medications without difficulty or side effects. She is still in agreement with plan to discharge to [facility]. Discussed with patient about results from sexually transmitted infection screening, and informed patient of positive result for gonorrhea. Pt shared that she suspected her partner of infidelity, and she is not surprised by this result. Discussed with patient we would give her antibiotics to treat her infection, and she was in agreement. She had no further questions, comments, or concerns.  Principal Problem: Severe recurrent major depression without psychotic features (HCC) Diagnosis:   Patient Active Problem List   Diagnosis Date Noted  . Severe recurrent major depression without psychotic features (HCC) [F33.2] 09/05/2017  . Polysubstance (excluding opioids) dependence,  daily use (HCC) [F19.20] 09/05/2017  . Substance induced mood disorder (HCC) [F19.94] 09/05/2017  . PTSD (post-traumatic stress disorder) [F43.10] 09/05/2017  . Anemia [D64.9] 02/07/2013  . Depression [F32.9] 02/07/2013   Total Time spent with patient: 30 minutes  Past Psychiatric History: see H&P  Past Medical History:  Past Medical History:  Diagnosis Date  . Anemia 02/07/2013  . Anxiety   . Asthma   . Depression   . Panic attack   . PID (acute pelvic inflammatory disease)   . Pregnant   . UTI (urinary tract infection)     Past Surgical History:  Procedure Laterality Date  . BLADDER SURGERY     Family History:  Family History  Problem Relation Age of Onset  . Hypertension Paternal Grandfather   . Stroke Paternal Grandfather   . Cancer Maternal Grandmother   . Hypertension Maternal Grandmother   . Cancer Maternal Grandfather   . Hypertension Father   . Stroke Father   . Cancer Mother   . Hypertension Mother   . Cancer Other        Lung, Throat annd Mouth   Family Psychiatric  History: see H&P Social History:  Social History   Substance and Sexual Activity  Alcohol Use Yes   Comment: occassional     Social History   Substance and Sexual Activity  Drug Use Yes  . Types: Marijuana, Cocaine   Comment: occasionally    Social History   Socioeconomic History  . Marital status: Single    Spouse name: None  . Number of children: None  . Years of education: None  . Highest education level: None  Social Needs  .  Financial resource strain: None  . Food insecurity - worry: None  . Food insecurity - inability: None  . Transportation needs - medical: None  . Transportation needs - non-medical: None  Occupational History  . None  Tobacco Use  . Smoking status: Current Every Day Smoker    Packs/day: 4.00    Types: Cigarettes  . Smokeless tobacco: Never Used  . Tobacco comment: Quit smoking with positive preg. test.  Substance and Sexual Activity  .  Alcohol use: Yes    Comment: occassional  . Drug use: Yes    Types: Marijuana, Cocaine    Comment: occasionally  . Sexual activity: Yes    Birth control/protection: None  Other Topics Concern  . None  Social History Narrative  . None   Additional Social History:                         Sleep: Fair  Appetite:  Fair  Current Medications: Current Facility-Administered Medications  Medication Dose Route Frequency Provider Last Rate Last Dose  . acetaminophen (TYLENOL) tablet 650 mg  650 mg Oral Q6H PRN Nira ConnBerry, Jason A, NP   650 mg at 09/07/17 0837  . albuterol (PROVENTIL HFA;VENTOLIN HFA) 108 (90 Base) MCG/ACT inhaler 1-2 puff  1-2 puff Inhalation Q6H PRN Armandina StammerNwoko, Agnes I, NP   2 puff at 09/08/17 1412  . alum & mag hydroxide-simeth (MAALOX/MYLANTA) 200-200-20 MG/5ML suspension 30 mL  30 mL Oral Q4H PRN Nira ConnBerry, Jason A, NP      . ARIPiprazole (ABILIFY) tablet 5 mg  5 mg Oral Daily Nwoko, Agnes I, NP   5 mg at 09/08/17 0804  . aspirin-acetaminophen-caffeine (EXCEDRIN MIGRAINE) per tablet 1 tablet  1 tablet Oral Q8H PRN Armandina StammerNwoko, Agnes I, NP      . azithromycin (ZITHROMAX) tablet 1,000 mg  1,000 mg Oral Once Armandina StammerNwoko, Agnes I, NP      . cefTRIAXone (ROCEPHIN) injection 250 mg  250 mg Intramuscular Once Armandina StammerNwoko, Agnes I, NP      . gabapentin (NEURONTIN) capsule 300 mg  300 mg Oral TID Armandina StammerNwoko, Agnes I, NP   300 mg at 09/08/17 1148  . ibuprofen (ADVIL,MOTRIN) tablet 600 mg  600 mg Oral Q6H PRN Nira ConnBerry, Jason A, NP   600 mg at 09/07/17 2122  . magnesium hydroxide (MILK OF MAGNESIA) suspension 30 mL  30 mL Oral Daily PRN Nira ConnBerry, Jason A, NP      . mirtazapine (REMERON) tablet 15 mg  15 mg Oral QHS Armandina StammerNwoko, Agnes I, NP   15 mg at 09/07/17 2123  . multivitamin with minerals tablet 1 tablet  1 tablet Oral Daily Armandina StammerNwoko, Agnes I, NP   1 tablet at 09/08/17 0804  . neomycin-bacitracin-polymyxin (NEOSPORIN) ointment   Topical PRN Nira ConnBerry, Jason A, NP      . nicotine (NICODERM CQ - dosed in mg/24 hours) patch 21  mg  21 mg Transdermal Daily Nira ConnBerry, Jason A, NP   21 mg at 09/08/17 0806  . prazosin (MINIPRESS) capsule 1 mg  1 mg Oral QHS Armandina StammerNwoko, Agnes I, NP   1 mg at 09/07/17 2126  . thiamine (VITAMIN B-1) tablet 100 mg  100 mg Oral Daily Armandina StammerNwoko, Agnes I, NP   100 mg at 09/08/17 0803  . traZODone (DESYREL) tablet 50 mg  50 mg Oral QHS PRN Jackelyn PolingBerry, Jason A, NP   50 mg at 09/07/17 2124    Lab Results:  Results for orders placed or performed during the hospital  encounter of 09/05/17 (from the past 48 hour(s))  HIV antibody (routine testing) (NOT for [facility])     Status: None   Collection Time: 09/06/17  6:23 PM  Result Value Ref Range   HIV Screen 4th Generation wRfx Non Reactive Non Reactive    Comment: (Crawford) Performed At: [facility] [address] [place], [place] [id number] Jolene Schimke MD [id number] Performed at [facility], [address] [place], [place] [address]   RPR     Status: None   Collection Time: 09/06/17  6:23 PM  Result Value Ref Range   RPR Ser Ql Non Reactive Non Reactive    Comment: (Crawford) Performed At: [facility] [address] [place], [place] [id number] Jolene Schimke MD [id number] Performed at [facility], [address], [place], [place] [address]     Blood Alcohol level:  Lab Results  Component Value Date   ETH <10 09/04/2017   ETH 267 (H) 11/09/2016    Metabolic Disorder Labs: Lab Results  Component Value Date   HGBA1C 4.9 09/06/2017   MPG 93.93 09/06/2017   Lab Results  Component Value Date   PROLACTIN 30.1 (H) 09/06/2017   Lab Results  Component Value Date   CHOL 135 09/06/2017   TRIG 234 (H) 09/06/2017   HDL 62 09/06/2017   CHOLHDL 2.2 09/06/2017   VLDL 47 (H) 09/06/2017   LDLCALC 26 09/06/2017    Physical Findings: AIMS: Facial and Oral Movements Muscles of Facial Expression: None, normal Lips and Perioral Area: None, normal Jaw: None, normal Tongue: None, normal,Extremity  Movements Upper (arms, wrists, hands, fingers): None, normal Lower (legs, knees, ankles, toes): None, normal, Trunk Movements Neck, shoulders, hips: None, normal, Overall Severity Severity of abnormal movements (highest score from questions above): None, normal Incapacitation due to abnormal movements: None, normal Patient's awareness of abnormal movements (rate only patient's report): No Awareness, Dental Status Current problems with teeth and/or dentures?: No Does patient usually wear dentures?: No  CIWA:  CIWA-Ar Total: 0 COWS:     Musculoskeletal: Strength & Muscle Tone: within normal limits Gait & Station: normal Patient leans: N/A  Psychiatric Specialty Exam: Physical Exam  Nursing Crawford and vitals reviewed.   Review of Systems  Constitutional: Negative for chills and fever.  Cardiovascular: Negative for chest pain and palpitations.  Psychiatric/Behavioral: Positive for depression. Negative for hallucinations and suicidal ideas. The patient is nervous/anxious.     Blood pressure 133/81, pulse 90, temperature 98.2 F (36.8 C), temperature source Oral, resp. rate 16, height 5\' 6"  (1.676 m), weight 78.9 kg (174 lb), last menstrual period 09/04/2017.Body mass index is 28.08 kg/m.  General Appearance: Casual and Fairly Groomed  Eye Contact:  Good  Speech:  Clear and Coherent and Normal Rate  Volume:  Normal  Mood:  Anxious and Depressed  Affect:  Appropriate and Congruent  Thought Process:  Coherent and Goal Directed  Orientation:  Full (Time, Place, and Person)  Thought Content:  Logical  Suicidal Thoughts:  No  Homicidal Thoughts:  No  Memory:  Immediate;   Fair Recent;   Fair Remote;   Fair  Judgement:  Fair  Insight:  Fair  Psychomotor Activity:  Normal  Concentration:  Concentration: Fair  Recall:  [employer] of Knowledge:  Fair  Language:  Fair  Akathisia:  No  Handed:    AIMS (if indicated):     Assets:  Communication Skills Resilience Social Support   ADL's:  Intact  Cognition:  WNL  Sleep:  Number of Hours: 6     Treatment Plan Summary: Daily contact with patient to assess and evaluate symptoms and progress in treatment and Medication management. Pt reports some ongoing anxiety and nightmares, but overall her mood symptoms are stable and she feels ready to discharge to substance use treatment program tomorrow. She agrees to increase dose of prazosin tonight. She will be given antibiotics for STI.  - Continue inpatient hospitalization.  -MDD and PTSD    - Continue Abilify 5 mg po daily.   - Continue remeron 15mg  qhs  - Anxiety.     - Continue Gabapentin 300 mg po tid.  Insomnia.     - Chagne Trazodone 50 mg po Qhs to trazodone 100mg  qhs.     - Change Minipress 1 mg qhs to minipress 2mg  qhs      - Continue all other prn medication regimen for the other medical complaints.  - Gonorrhea infection   - Give Ceftriaxone 250mg  IM once    - Give azithromycin 1000mg  PO once  Patient to participate in the group sessions.  SW to continue to work on the discharge disposition.   Micheal Likens, MD 09/08/2017, 3:16 PM

## 2017-09-08 NOTE — Progress Notes (Signed)
D: Patient has been visible in the milieu.  She is pleasant with staff.  She was informed of her lab results and was given an antibiotic and injection for STD.  Patient denies any thoughts of self harm.  She has numerous abrasions on both arms and around the lower parts of her legs.  Spoke with patient about a 1" screw that was found in her bedding when [employer] employees were changing the bed.  She stated, "I haven't been cutting myself.  These cuts are before I came here."  The screw came out of the frame to the doorway and [facility] same.  Informed patient that staff wanted to make sure she was safe and not harming herself.  She rates her depression as a 6; hopelessness as a 9; anxiety as an 8.  She continues to report some withdrawal symptoms.  [facility]  A: Continue to monitor medication management and MD orders.  Safety checks completed every 15 minutes per protocol.  Offer support and encouragement as needed.  R: Patient is receptive to staff; her behavior is appropriate.

## 2017-09-08 NOTE — Progress Notes (Signed)
[facility] 11[facility] on Friday RN and MD are aware of plan as well as patient.  Patient will need 21 day supply of medication. No other needs at this time.  Deretha EmoryHannah Whitt Auletta LCSW, MSW Clinical Social Work: Optician, dispensingystem [employer]

## 2017-09-08 NOTE — Progress Notes (Signed)
LCSW completed letter to probation officer upon patient request.  Fax sent. [facility] and patient is scheduled to complete pre-screen this afternoon for possible admission on Friday.  If no bed on Friday, [facility]  Patient aware of prescreen today and left instructions. She voices understanding of plan and in agreement.  Deretha EmoryHannah Annelie Boak LCSW, MSW Clinical Social Work: Optician, dispensingystem [employer]
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
## 2017-09-08 NOTE — Plan of Care (Signed)
  Progressing Activity: Interest or engagement in activities will improve 09/08/2017 1841 - Progressing by Angela AdamBeaudry, Griselda Tosh E, RN Sleeping patterns will improve 09/08/2017 1841 - Progressing by Angela AdamBeaudry, Anahita Cua E, RN Education: Knowledge of [facility] Education information/materials will improve 09/08/2017 1841 - Progressing by Angela AdamBeaudry, Lara Palinkas E, RN Emotional status will improve 09/08/2017 1841 - Progressing by Angela AdamBeaudry, Kieran Arreguin E, RN Mental status will improve 09/08/2017 1841 - Progressing by Angela AdamBeaudry, Ahmia Colford E, RN Verbalization of understanding the information provided will improve 09/08/2017 1841 - Progressing by Angela AdamBeaudry, Tariah Transue E, RN

## 2017-09-08 NOTE — Progress Notes (Signed)
[facility]  She will be picked up at 11 am on Friday.  She will need 21 day supply of medication.

## 2017-09-09 MED ORDER — ACETAMINOPHEN 325 MG PO TABS: 650.0000 mg | ORAL_TABLET | Freq: Four times a day (QID) | ORAL | Status: AC | PRN

## 2017-09-09 MED ORDER — GABAPENTIN 300 MG PO CAPS: 300.0000 mg | ORAL_CAPSULE | Freq: Three times a day (TID) | ORAL | 0 refills | Status: AC

## 2017-09-09 MED ORDER — ALBUTEROL SULFATE HFA 108 (90 BASE) MCG/ACT IN AERS: 1.0000 | INHALATION_SPRAY | Freq: Four times a day (QID) | RESPIRATORY_TRACT | Status: AC | PRN

## 2017-09-09 MED ORDER — MIRTAZAPINE 15 MG PO TABS: 15.0000 mg | ORAL_TABLET | Freq: Every day | ORAL | 0 refills | Status: AC

## 2017-09-09 MED ORDER — TRAZODONE HCL 100 MG PO TABS: 100.0000 mg | ORAL_TABLET | Freq: Every evening | ORAL | 0 refills | Status: AC | PRN

## 2017-09-09 MED ORDER — ARIPIPRAZOLE 5 MG PO TABS: 5.0000 mg | ORAL_TABLET | Freq: Every day | ORAL | 0 refills | Status: AC

## 2017-09-09 MED ORDER — NICOTINE POLACRILEX 2 MG MT GUM: 2.0000 mg | CHEWING_GUM | OROMUCOSAL | 0 refills | Status: AC | PRN

## 2017-09-09 MED ORDER — PRAZOSIN HCL 2 MG PO CAPS: 2.0000 mg | ORAL_CAPSULE | Freq: Every day | ORAL | 0 refills | Status: AC

## 2017-09-09 MED ORDER — HYDROXYZINE HCL 50 MG PO TABS: 50.0000 mg | ORAL_TABLET | Freq: Four times a day (QID) | ORAL | 0 refills | Status: AC | PRN

## 2017-09-09 NOTE — Progress Notes (Signed)
D: Pt was in the hallway upon initial approach.  Pt presents with anxious, depressed affect and mood.  She reports her day was "not good, I didn't get to see my child."  Pt reports she is discharging tomorrow and she feels safe to do so; states "I'm ready to go."  Pt denies SI/HI, denies hallucinations, denies pain.  Pt has been visible in milieu interacting with peers and staff appropriately.  Pt attended evening group.    A: Introduced self to pt.  Actively listened to pt and offered support and encouragement. Medications administered per order.  PRN medication administered for anxiety and sleep.  Medication education provided.  Fall prevention techniques reviewed with pt, pt verbalized understanding.  PO fluids encouraged and provided.  Q15 minute safety checks maintained.  R: Pt is safe on the unit.  Pt is compliant with medications.  Pt verbally contracts for safety.  Will continue to monitor and assess.
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
## 2017-09-09 NOTE — [facility] Suicide Risk Assessment (Signed)
[facility]   Principal Problem: Severe recurrent major depression without psychotic features [facility](HCC) Discharge Diagnoses:  Patient Active Problem List   Diagnosis Date Noted  . Severe recurrent major depression without psychotic features (HCC) [F33.2] 09/05/2017  . Polysubstance (excluding opioids) dependence, daily use (HCC) [F19.20] 09/05/2017  . Substance induced mood disorder (HCC) [F19.94] 09/05/2017  . PTSD (post-traumatic stress disorder) [F43.10] 09/05/2017  . Anemia [D64.9] 02/07/2013  . Depression [F32.9] 02/07/2013    Total Time spent with patient: 30 minutes  Musculoskeletal: Strength & Muscle Tone: within normal limits Gait & Station: normal Patient leans: N/A  Psychiatric Specialty Exam: Review of Systems  Constitutional: Negative for chills and fever.  Respiratory: Negative for cough and shortness of breath.   Cardiovascular: Negative for chest pain.  Gastrointestinal: Negative for abdominal pain, heartburn, nausea and vomiting.  Psychiatric/Behavioral: Negative for depression, hallucinations and suicidal ideas. The patient is not nervous/anxious.     Blood pressure 130/60, pulse (!) 109, temperature 98.5 F (36.9 C), temperature source Oral, resp. rate 18, height 5\' 6"  (1.676 m), weight 78.9 kg (174 lb), last menstrual period 09/04/2017.Body mass index is 28.08 kg/m.  General Appearance: Casual and Fairly Groomed  Eye Contact::  Good  Speech:  Clear and Coherent and Normal Rate  Volume:  Normal  Mood:  Euthymic  Affect:  Appropriate, Congruent and Full Range  Thought Process:  Coherent and Goal Directed  Orientation:  Full (Time, Place, and Person)  Thought Content:  Logical  Suicidal Thoughts:  No  Homicidal Thoughts:  No  Memory:  Immediate;   Good Recent;   Good Remote;   Good  Judgement:  Fair  Insight:  Fair  Psychomotor Activity:  Normal  Concentration:  Fair  Recall:  Fair  Fund of Knowledge:Fair  Language: Fair   Akathisia:  No  Handed:    AIMS (if indicated):     Assets:  Manufacturing systems engineerCommunication Skills Physical Health Resilience Social Support  Sleep:  Number of Hours: 6.75  Cognition: WNL  ADL's:  Intact   Mental Status Per Nursing Assessment::   On Admission:  Suicidal ideation indicated by others, Self-harm thoughts, Self-harm behaviors  Demographic Factors:  Caucasian and Low socioeconomic status  Loss Factors: Financial problems/change in socioeconomic status  Historical Factors: Impulsivity  Risk Reduction Factors:   Positive social support, Positive therapeutic relationship and Positive coping skills or problem solving skills  Continued Clinical Symptoms:  Severe Anxiety and/or Agitation Depression:   Comorbid alcohol abuse/dependence Alcohol/Substance Abuse/Dependencies More than one psychiatric diagnosis Unstable or Poor Therapeutic Relationship  Cognitive Features That Contribute To Risk:  None    Suicide Risk:  Minimal: No identifiable suicidal ideation.  Patients presenting with no risk factors but with morbid ruminations; may be classified as minimal risk based on the severity of the depressive symptoms  Follow-up Information    [facility] Follow up on 09/09/2017.   Specialty:  Addiction Medicine Why:  Will pick you up day of d/c at 11:00 AM Contact information: [address] [place] [address] [phone]        [facility] Follow up.   Why:  appt needed if pt does not go directly to rehab.  Contact information: [address] [place] [address] [phone]         Subjective Data:  Adrienne Crawford is a 27 y/o F with history of MDD, PTSD, and polysubstance abuse who was admitted voluntarily with worsening depression, SI without plan, self-injurious  behavior, and worsening illicit substance use. She was started on CIWA protocol as well started on combination of abilify and remeron. She was also started on  prazosin for treatment of nightmares. She was accepted to substance use treatment for today 09/09/17 to [facility]   Today upon evaluation, pt shares, "I'm alright - just frustrated because I thought someone was going to drop my stuff off before I go to [facility]." Pt confirms that she is still motivated for discharge to [facility] and she is fully onboard with her treatment recommendations. She notes that she slept better last night than she has in a long time. She feels that her medications are helpful and she would like to continue them as currently ordered. She denies SI/HI/AH/VH. She has no physical complaints today. She was able to engage in safety planning including plan to return to [facility] or contact emergency services if she feels unable to maintain her own safety or the safety of others. Pt had no further questions, comments, or concerns.   Plan Of Care/Follow-up recommendations:   - Discharge to outpatient level of care (Pt will DC directly to [facility] for substance use treatment)  -MDD and PTSD             - Continue Abilify 5 mg po daily.            - Continue remeron 15mg  qhs  - Anxiety. - Continue Gabapentin 300 mg po tid.  Insomnia. - Continue trazodone 100mg  qhs. - Continue minipress 2mg  qhs   Activity:  as tolerated Diet:  normal Tests:  NA Other:  see above for DC plan  Micheal Likens, MD 27/18/2019, 10:34 AM

## 2017-09-09 NOTE — Progress Notes (Signed)
  [facility]  Will you be returning to the same living situation after discharge:  No. [facility] At discharge, do you have transportation home?: Yes,  [facility] Do you have the ability to pay for your medications: Yes,  [facility]CD  Release of information consent forms completed and in the chart;  Patient's signature needed at discharge.  Patient to Follow up at: Follow-up Information    [facility] Follow up.   Specialty:  Addiction Medicine Why:  Referral faxed: 09/05/17 Contact information: [address] [place] [address] [phone]        [facility] Follow up.   Why:  appt needed if pt does not go directly to rehab.  Contact information: [address] [place] [address] [phone]           Next level of care provider has access to [facility] Link:no  Safety Planning and Suicide Prevention discussed: Yes,  yes  Have you used any form of tobacco in the last 30 days? (Cigarettes, Smokeless Tobacco, Cigars, and/or Pipes): Yes  Has patient been referred to the Quitline?: Patient refused referral  Patient has been referred for addiction treatment: Yes  Ida RogueRodney B Clemons Salvucci, LCSW 09/09/2017, 8:50 AM

## 2017-09-09 NOTE — Progress Notes (Signed)
Recreation Therapy Notes  Date: 09/09/17 Time: 0930 Location: [address] Dayroom  Group Topic: Stress Management  Goal Area(s) Addresses:  Patient will verbalize importance of using healthy stress management.  Patient will identify positive emotions associated with healthy stress management.   Intervention: Stress Management  Activity :  Meditation.  LRT introduced the stress management technique of meditation.  LRT played a meditation dealing with gratitude.  Patients were to listen and follow along as the meditation played.  Education:  Stress Management, Discharge Planning.   Education Outcome: Acknowledges edcuation/In group clarification offered/Needs additional education  Clinical Observations/Feedback: Pt did not attend group.    Maclain Cohron, LRT/CTRS         Jannett Schmall A 09/09/2017 1:19 PM 

## 2017-09-09 NOTE — Progress Notes (Signed)
Data. Patient denies SI/HI/AVH. Verbally contracts for safety on the unit and to come to staff before acting of any self harm thoughts/feelings.Patient interacting well with staff and other patients. Affect was anxious and somewhat angry in the early morning. Patient reported. "I am leaving this morning and I can't get a hold of my friend to get her to bring my clothes and cigarettes. I [facility] to smoke [facility]."  Action. Emotional support and encouragement offered. Education provided on medication, indications and side effect. Q 15 minute checks done for safety. Response. Safety on the unit maintained through 15 minute checks.  Medications taken as prescribed. Attended groups. Remained calm and appropriate through out shift.  [facility]  Belongings sheet reviewed and signed by pt. and all belongings sent with her including medication samples and medication scripts. Paperwork reviewed and pt. able to verbalize understanding of education. Pt. in no current distress and ambulatory.

## 2017-09-25 DIAGNOSIS — Z79899 Other long term (current) drug therapy: Secondary | ICD-10-CM | POA: Insufficient documentation

## 2017-09-25 DIAGNOSIS — R42 Dizziness and giddiness: Secondary | ICD-10-CM | POA: Diagnosis not present

## 2017-09-25 DIAGNOSIS — F17210 Nicotine dependence, cigarettes, uncomplicated: Secondary | ICD-10-CM | POA: Diagnosis not present

## 2017-09-25 DIAGNOSIS — R531 Weakness: Secondary | ICD-10-CM | POA: Insufficient documentation

## 2017-09-25 DIAGNOSIS — J45909 Unspecified asthma, uncomplicated: Secondary | ICD-10-CM | POA: Insufficient documentation

## 2017-09-25 DIAGNOSIS — E162 Hypoglycemia, unspecified: Secondary | ICD-10-CM | POA: Insufficient documentation

## 2017-09-26 ENCOUNTER — Encounter (HOSPITAL_COMMUNITY): Payer: Self-pay | Admitting: *Deleted

## 2017-09-26 ENCOUNTER — Other Ambulatory Visit: Payer: Self-pay

## 2017-09-26 ENCOUNTER — Emergency Department (HOSPITAL_COMMUNITY)
Admission: EM | Admit: 2017-09-26 | Discharge: 2017-09-26 | Discharge status: Against Medical Advice | Payer: Medicaid Other | Attending: Emergency Medicine | Admitting: Emergency Medicine

## 2017-09-26 DIAGNOSIS — R531 Weakness: Secondary | ICD-10-CM

## 2017-09-26 DIAGNOSIS — E162 Hypoglycemia, unspecified: Secondary | ICD-10-CM

## 2017-09-26 DIAGNOSIS — R42 Dizziness and giddiness: Secondary | ICD-10-CM

## 2017-09-26 LAB — COMPREHENSIVE METABOLIC PANEL
ALBUMIN: 3.7 g/dL (ref 3.5–5.0)
ALK PHOS: 64 U/L (ref 38–126)
ALT: 220 U/L — ABNORMAL HIGH (ref 14–54)
AST: 119 U/L — AB (ref 15–41)
Anion gap: 10 (ref 5–15)
BILIRUBIN TOTAL: 0.7 mg/dL (ref 0.3–1.2)
BUN: 18 mg/dL (ref 6–20)
CALCIUM: 9.2 mg/dL (ref 8.9–10.3)
CO2: 24 mmol/L (ref 22–32)
CREATININE: 0.64 mg/dL (ref 0.44–1.00)
Chloride: 102 mmol/L (ref 101–111)
GFR calc Af Amer: >60 mL/min (ref >60)
GLUCOSE: 94 mg/dL (ref 65–99)
Potassium: 3.9 mmol/L (ref 3.5–5.1)
Sodium: 136 mmol/L (ref 135–145)
TOTAL PROTEIN: 6.9 g/dL (ref 6.5–8.1)

## 2017-09-26 LAB — CBC WITH DIFFERENTIAL/PLATELET
BASOS ABS: 0.1 10*3/uL (ref 0.0–0.1)
BASOS PCT: 1 %
EOS PCT: 1 %
Eosinophils Absolute: 0.1 10*3/uL (ref 0.0–0.7)
HEMATOCRIT: 37.6 % (ref 36.0–46.0)
Hemoglobin: 12.9 g/dL (ref 12.0–15.0)
Lymphocytes Relative: 63 %
Lymphs Abs: 4 10*3/uL (ref 0.7–4.0)
MCH: 33.9 pg (ref 26.0–34.0)
MCHC: 34.3 g/dL (ref 30.0–36.0)
MCV: 98.7 fL (ref 78.0–100.0)
MONO ABS: 0.7 10*3/uL (ref 0.1–1.0)
MONOS PCT: 12 %
NEUTROS ABS: 1.5 10*3/uL — AB (ref 1.7–7.7)
Neutrophils Relative %: 23 %
PLATELETS: 108 10*3/uL — AB (ref 150–400)
RBC: 3.81 MIL/uL — ABNORMAL LOW (ref 3.87–5.11)
RDW: 13.1 % (ref 11.5–15.5)
WBC: 6.3 10*3/uL (ref 4.0–10.5)

## 2017-09-26 LAB — CBG MONITORING, ED
GLUCOSE-CAPILLARY: 97 mg/dL (ref 65–99)
Glucose-Capillary: 110 mg/dL — ABNORMAL HIGH (ref 65–99)

## 2017-09-26 LAB — SALICYLATE LEVEL

## 2017-09-26 LAB — ACETAMINOPHEN LEVEL: Acetaminophen (Tylenol), Serum: <10 ug/mL — ABNORMAL LOW (ref 10–30)

## 2017-09-26 LAB — ETHANOL

## 2017-09-26 NOTE — ED Triage Notes (Addendum)
Pt states she had a dizzy spell today and her sister has a glucose meter and checked her cbg, it was 30; pt states she drank OJ and ate peanut butter x 3 hours ago; pt's cbg now is 110; pt states she still feels dizzy; pt states she just was discharged from rehab x 3 days ago

## 2017-09-26 NOTE — ED Provider Notes (Signed)
[facility] EMERGENCY DEPARTMENT Provider Note   CSN: [id number] Arrival date & time: 09/25/17  2259  Time seen 01:50 AM   History   Chief Complaint Chief Complaint  Patient presents with  . Dizziness    HPI Adrienne Crawford is a 27 y.o. female.  HPI patient states she was admitted to behavioral health and then transferred to [facility] for a month.  She states she was detoxed from alcohol although when I look at her UDS from before she had positive cocaine, benzos, amphetamines, and marijuana.  She states she lost her prescriptions when she was discharged from rehab on February 1.  She states she has had no medications for 3 days.  She states about 2:30 PM she was in the mall and she started feeling lightheaded and "felt weird" like she was "walking on a cloud".  She states she ate at the mall and then went home and took a nap from 4 PM to 7 PM.  When she woke up she still did not feel good and states her legs felt tingly and weak.  She used a friend's meter to check her CBG and it was 33 and she states she "freaked out".  She states she ate.  She states she still feels lightheaded.  She states the medication she was taking were started at behavioral health, she was not on medication prior to going there.  PCP none  Past Medical History:  Diagnosis Date  . Anemia 02/07/2013  . Anxiety   . Asthma   . Depression   . Panic attack   . PID (acute pelvic inflammatory disease)   . Pregnant   . UTI (urinary tract infection)     Patient Active Problem List   Diagnosis Date Noted  . Severe recurrent major depression without psychotic features (HCC) 09/05/2017  . Polysubstance (excluding opioids) dependence, daily use (HCC) 09/05/2017  . Substance induced mood disorder (HCC) 09/05/2017  . PTSD (post-traumatic stress disorder) 09/05/2017  . Anemia 02/07/2013  . Depression 02/07/2013    Past Surgical History:  Procedure Laterality Date  . BLADDER SURGERY      OB History    Gravida  Para Term Preterm AB Living   3 2 2   1 2    SAB TAB Ectopic Multiple Live Births   1       2       Home Medications    Prior to Admission medications   Medication Sig Start Date End Date Taking? Authorizing Provider  acetaminophen (TYLENOL) 325 MG tablet Take 2 tablets (650 mg total) by mouth every 6 (six) hours as needed. For fever/pain 09/09/17   Armandina Stammer I, NP  albuterol (PROVENTIL HFA;VENTOLIN HFA) 108 (90 Base) MCG/ACT inhaler Inhale 1-2 puffs into the lungs every 6 (six) hours as needed for wheezing or shortness of breath. 09/09/17   Armandina Stammer I, NP  ARIPiprazole (ABILIFY) 5 MG tablet Take 1 tablet (5 mg total) by mouth daily. For mood control 09/10/17   Armandina Stammer I, NP  gabapentin (NEURONTIN) 300 MG capsule Take 1 capsule (300 mg total) by mouth 3 (three) times daily. For agitation 09/09/17   Armandina Stammer I, NP  hydrOXYzine (ATARAX/VISTARIL) 50 MG tablet Take 1 tablet (50 mg total) by mouth every 6 (six) hours as needed for anxiety. 09/09/17   Armandina Stammer I, NP  mirtazapine (REMERON) 15 MG tablet Take 1 tablet (15 mg total) by mouth at bedtime. For depression/anxiety 09/09/17   Sanjuana Kava,  NP  nicotine polacrilex (NICORETTE) 2 MG gum Take 1 each (2 mg total) by mouth as needed for smoking cessation. (May buy from over the counter at the pharmacy): For smoking cessation 09/09/17   Armandina Stammer I, NP  prazosin (MINIPRESS) 2 MG capsule Take 1 capsule (2 mg total) by mouth at bedtime. For nightmares 09/09/17   Armandina Stammer I, NP  traZODone (DESYREL) 100 MG tablet Take 1 tablet (100 mg total) by mouth at bedtime as needed for sleep. 09/09/17   Sanjuana Kava, NP    Family History Family History  Problem Relation Age of Onset  . Hypertension Paternal Grandfather   . Stroke Paternal Grandfather   . Cancer Maternal Grandmother   . Hypertension Maternal Grandmother   . Cancer Maternal Grandfather   . Hypertension Father   . Stroke Father   . Cancer Mother   . Hypertension Mother    . Cancer Other        Lung, Throat annd Mouth    Social History Social History   Tobacco Use  . Smoking status: Current Every Day Smoker    Packs/day: 4.00    Types: Cigarettes  . Smokeless tobacco: Never Used  . Tobacco comment: Quit smoking with positive preg. test.  Substance Use Topics  . Alcohol use: Yes    Comment: occassional  . 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    Physical Exam Updated Vital Signs BP (!) 140/102 (BP Location: Right Arm)   Pulse 90   Temp 98.6 F (37 C) (Oral)   Resp 16   Ht 5\' 6"  (1.676 m)   Wt 79.4 kg (175 lb)   LMP 09/04/2017   SpO2 99%   BMI 28.25 kg/m   Vital signs normal except hypertension however during my exam her blood pressure was 123/75 which is normal   Physical Exam  Constitutional: She is oriented to person, place, and time. She appears well-developed and well-nourished.  Non-toxic appearance. She does not appear ill. No distress.  HENT:  Head: Normocephalic and atraumatic.  Right Ear: External ear normal.  Left Ear: External ear normal.  Nose: Nose normal. No mucosal edema or rhinorrhea.  Mouth/Throat: Oropharynx is clear and moist and mucous membranes are normal. No dental abscesses or uvula swelling.  Eyes: Conjunctivae and EOM are normal. Pupils are equal, round, and reactive to light.  Neck: Normal range of motion and full passive range of motion without pain. Neck supple.  Cardiovascular: Normal rate, regular rhythm and normal heart sounds. Exam reveals no gallop and no friction rub.  No murmur heard. Pulmonary/Chest: Effort normal and breath sounds normal. No respiratory distress. She has no wheezes. She has no rhonchi. She has no rales. She exhibits no tenderness and no crepitus.  Abdominal: Soft. Normal appearance and  bowel sounds are normal. She exhibits no distension. There is no tenderness. There is no rebound and no guarding.  Musculoskeletal: Normal range of motion. She exhibits no edema or tenderness.  Moves all extremities well.   Neurological: She is alert and oriented to person, place, and time. She has normal strength. No cranial nerve deficit.  Skin: Skin is warm, dry and intact. No rash noted. No erythema. No pallor.  Psychiatric: She has a normal mood and affect. Her  speech is normal and behavior is normal. Her mood appears not anxious.  Nursing note and vitals reviewed.    ED Treatments / Results  Labs (all labs ordered are listed, but only abnormal results are displayed) Results for orders placed or performed during the hospital encounter of 09/26/17  Comprehensive metabolic panel  Result Value Ref Range   Sodium 136 135 - 145 mmol/L   Potassium 3.9 3.5 - 5.1 mmol/L   Chloride 102 101 - 111 mmol/L   CO2 24 22 - 32 mmol/L   Glucose, Bld 94 65 - 99 mg/dL   BUN 18 6 - 20 mg/dL   Creatinine, Ser 8.650.64 0.44 - 1.00 mg/dL   Calcium 9.2 8.9 - 78.410.3 mg/dL   Total Protein 6.9 6.5 - 8.1 g/dL   Albumin 3.7 3.5 - 5.0 g/dL   AST 696119 (H) 15 - 41 U/L   ALT 220 (H) 14 - 54 U/L   Alkaline Phosphatase 64 38 - 126 U/L   Total Bilirubin 0.7 0.3 - 1.2 mg/dL   GFR calc non Af Amer >60 >60 mL/min   GFR calc Af Amer >60 >60 mL/min   Anion gap 10 5 - 15  Ethanol  Result Value Ref Range   Alcohol, Ethyl (B) <10 <10 mg/dL  CBC with Differential  Result Value Ref Range   WBC 6.3 4.0 - 10.5 K/uL   RBC 3.81 (L) 3.87 - 5.11 MIL/uL   Hemoglobin 12.9 12.0 - 15.0 g/dL   HCT 29.537.6 28.436.0 - 13.246.0 %   MCV 98.7 78.0 - 100.0 fL   MCH 33.9 26.0 - 34.0 pg   MCHC 34.3 30.0 - 36.0 g/dL   RDW 44.013.1 10.211.5 - 72.515.5 %   Platelets 108 (L) 150 - 400 K/uL   Neutrophils Relative % 23 %   Neutro Abs 1.5 (L) 1.7 - 7.7 K/uL   Lymphocytes Relative 63 %   Lymphs Abs 4.0 0.7 - 4.0 K/uL   Monocytes Relative 12 %   Monocytes  Absolute 0.7 0.1 - 1.0 K/uL   Eosinophils Relative 1 %   Eosinophils Absolute 0.1 0.0 - 0.7 K/uL   Basophils Relative 1 %   Basophils Absolute 0.1 0.0 - 0.1 K/uL  Acetaminophen level  Result Value Ref Range   Acetaminophen (Tylenol), Serum <10 (L) 10 - 30 ug/mL  Salicylate level  Result Value Ref Range   Salicylate Lvl <7.0 2.8 - 30.0 mg/dL  CBG monitoring, ED  Result Value Ref Range   Glucose-Capillary 110 (H) 65 - 99 mg/dL  CBG monitoring, ED  Result Value Ref Range   Glucose-Capillary 97 65 - 99 mg/dL   Laboratory interpretation all normal except some elevation of her LFTs which was not present on January 13.    EKG  EKG Interpretation  Date/Time:  Monday September 26 2017 03:17:56 EST Ventricular Rate:  75 PR Interval:    QRS Duration: 104 QT Interval:  386 QTC Calculation: 432 R Axis:   69 Text Interpretation:  Sinus rhythm Nonspecific T abnormalities, anterior leads Since last tracing rate slower 06 Apr 2017 Confirmed by Devoria AlbeKnapp, Ersel Wadleigh ([id number]) on 09/26/2017 3:43:24 AM       Radiology No results found.  Procedures Procedures (including critical care time)  Medications Ordered in ED Medications - No data to display   Initial Impression / Assessment and Plan / ED Course  I have reviewed the triage vital signs and the nursing notes.  Pertinent labs & imaging results that were available during my  care of the patient were reviewed by me and considered in my medical decision making (see chart for details).     At the time of my exam her blood work had not resulted.  Patient was wanting to eat.  EKG was done due to her having the weakness and dizziness.  Patient feels like her symptoms are from hypertension however her blood pressure was normal during my exam.  She was also questioning how she can get a refill or another prescription for the medication she was started on at behavioral health and she "lost" when she was discharged from [facility].   4:30 a.m. nurses report  patient left AMA, she stated she needed to leave.  She did not provide a urine sample to do her UDS.   Final Clinical Impressions(s) / ED Diagnoses   Final diagnoses:  Lightheadedness  Weakness  Hypoglycemia    Pt left AMA   Devoria Albe, MD 09/26/17 [phone]

## 2017-09-26 NOTE — ED Notes (Signed)
Pt states she had to leave. RN advised of the risk for leaving and that she would have to sign out against medical advice. Pt states understanding. MD aware.

## 2017-10-13 ENCOUNTER — Other Ambulatory Visit: Payer: Self-pay

## 2017-10-13 ENCOUNTER — Emergency Department (HOSPITAL_COMMUNITY)
Admission: EM | Admit: 2017-10-13 | Discharge: 2017-10-13 | Discharge status: Against Medical Advice | Payer: Medicaid Other | Attending: Emergency Medicine | Admitting: Emergency Medicine

## 2017-10-13 ENCOUNTER — Encounter (HOSPITAL_COMMUNITY): Payer: Self-pay | Admitting: Emergency Medicine

## 2017-10-13 DIAGNOSIS — I10 Essential (primary) hypertension: Secondary | ICD-10-CM | POA: Insufficient documentation

## 2017-10-13 DIAGNOSIS — Z79899 Other long term (current) drug therapy: Secondary | ICD-10-CM | POA: Diagnosis not present

## 2017-10-13 DIAGNOSIS — R42 Dizziness and giddiness: Secondary | ICD-10-CM | POA: Diagnosis not present

## 2017-10-13 DIAGNOSIS — F17210 Nicotine dependence, cigarettes, uncomplicated: Secondary | ICD-10-CM | POA: Diagnosis not present

## 2017-10-13 DIAGNOSIS — J45909 Unspecified asthma, uncomplicated: Secondary | ICD-10-CM | POA: Diagnosis not present

## 2017-10-13 NOTE — ED Triage Notes (Signed)
Pt c/o high blood pressure pt states she has a headache denies visual changes.

## 2017-10-13 NOTE — ED Notes (Signed)
Pt left without further care. Pt states she is not incriminating herself because she will not pass the urine drug screen.

## 2017-10-13 NOTE — ED Provider Notes (Signed)
[facility] EMERGENCY DEPARTMENT Provider Note   CSN: [id number] Arrival date & time: 10/13/17  0127     History   Chief Complaint Chief Complaint  Patient presents with  . Hypertension    HPI Adrienne Crawford is a 27 y.o. female.  Patient presents with headache and elevated blood pressure.  States she was in [place] and began to feel lightheaded and dizzy with gradual onset headache and some blurry vision which has since resolved.  States she wanted to come get her blood pressure checked because she was concerned that is was high.  She is not on blood pressure medication but she is on several medications for psychiatric illness which she says affects her blood pressure.  She states she got out of rehab 3 weeks ago for alcohol abuse and does not use any drugs or alcohol since then.  She denies any chest pain or shortness of breath.  She denies any dizziness or lightheadedness.  Patient states she got pulled over by police on the way to the hospital which caused her to become anxious as she does not have a driver's license.  She denies any blurry vision currently.  No chest pain or shortness of breath.  States her headache has improved but she is still concerned about her blood pressure and want to get it checked.  She has been taking her medications but did not take anything today.   The history is provided by the patient.  Hypertension  Associated symptoms include headaches. Pertinent negatives include no chest pain, no abdominal pain and no shortness of breath.    Past Medical History:  Diagnosis Date  . Anemia 02/07/2013  . Anxiety   . Asthma   . Depression   . Panic attack   . PID (acute pelvic inflammatory disease)   . Pregnant   . UTI (urinary tract infection)     Patient Active Problem List   Diagnosis Date Noted  . Severe recurrent major depression without psychotic features (HCC) 09/05/2017  . Polysubstance (excluding opioids) dependence, daily use (HCC) 09/05/2017    . Substance induced mood disorder (HCC) 09/05/2017  . PTSD (post-traumatic stress disorder) 09/05/2017  . Anemia 02/07/2013  . Depression 02/07/2013    Past Surgical History:  Procedure Laterality Date  . BLADDER SURGERY      OB History    Gravida Para Term Preterm AB Living   3 2 2   1 2    SAB TAB Ectopic Multiple Live Births   1       2       Home Medications    Prior to Admission medications   Medication Sig Start Date End Date Taking? Authorizing Provider  acetaminophen (TYLENOL) 325 MG tablet Take 2 tablets (650 mg total) by mouth every 6 (six) hours as needed. For fever/pain 09/09/17   Armandina Stammer I, NP  albuterol (PROVENTIL HFA;VENTOLIN HFA) 108 (90 Base) MCG/ACT inhaler Inhale 1-2 puffs into the lungs every 6 (six) hours as needed for wheezing or shortness of breath. 09/09/17   Armandina Stammer I, NP  ARIPiprazole (ABILIFY) 5 MG tablet Take 1 tablet (5 mg total) by mouth daily. For mood control 09/10/17   Armandina Stammer I, NP  gabapentin (NEURONTIN) 300 MG capsule Take 1 capsule (300 mg total) by mouth 3 (three) times daily. For agitation 09/09/17   Armandina Stammer I, NP  hydrOXYzine (ATARAX/VISTARIL) 50 MG tablet Take 1 tablet (50 mg total) by mouth every 6 (six) hours as needed for  anxiety. 09/09/17   Armandina Stammer I, NP  mirtazapine (REMERON) 15 MG tablet Take 1 tablet (15 mg total) by mouth at bedtime. For depression/anxiety 09/09/17   Armandina Stammer I, NP  nicotine polacrilex (NICORETTE) 2 MG gum Take 1 each (2 mg total) by mouth as needed for smoking cessation. (May buy from over the counter at the pharmacy): For smoking cessation 09/09/17   Armandina Stammer I, NP  prazosin (MINIPRESS) 2 MG capsule Take 1 capsule (2 mg total) by mouth at bedtime. For nightmares 09/09/17   Armandina Stammer I, NP  traZODone (DESYREL) 100 MG tablet Take 1 tablet (100 mg total) by mouth at bedtime as needed for sleep. 09/09/17   Sanjuana Kava, NP    Family History Family History  Problem Relation Age of Onset   . Hypertension Paternal Grandfather   . Stroke Paternal Grandfather   . Cancer Maternal Grandmother   . Hypertension Maternal Grandmother   . Cancer Maternal Grandfather   . Hypertension Father   . Stroke Father   . Cancer Mother   . Hypertension Mother   . Cancer Other        Lung, Throat annd Mouth    Social History Social History   Tobacco Use  . Smoking status: Current Every Day Smoker    Packs/day: 4.00    Types: Cigarettes  . Smokeless tobacco: Never Used  . Tobacco comment: Quit smoking with positive preg. test.  Substance Use Topics  . Alcohol use: Yes    Comment: occassional  . Drug use: Yes    Types: Marijuana, Cocaine    Comment: occasionally     Allergies   Sulfa antibiotics   Review of Systems Review of Systems  Constitutional: Negative for activity change, appetite change and fever.  HENT: Negative for congestion and rhinorrhea.   Eyes: Positive for visual disturbance.  Respiratory: Positive for chest tightness. Negative for cough and shortness of breath.   Cardiovascular: Negative for chest pain.  Gastrointestinal: Negative for abdominal pain, nausea and vomiting.  Genitourinary: Negative for dysuria, hematuria, vaginal bleeding and vaginal discharge.  Musculoskeletal: Negative for arthralgias and myalgias.  Skin: Negative for rash.  Neurological: Positive for dizziness, light-headedness and headaches. Negative for weakness.   all other systems are negative except as noted in the HPI and PMH.     Physical Exam Updated Vital Signs BP (!) 140/129   Pulse 88   Temp 98.1 F (36.7 C) (Oral)   Resp 18   Ht 5\' 6"  (1.676 m)   Wt 83.9 kg (185 lb)   LMP 09/26/2017   SpO2 97%   BMI 29.86 kg/m   Physical Exam  Constitutional: She is oriented to person, place, and time. She appears well-developed and well-nourished. No distress.  anxious  HENT:  Head: Normocephalic and atraumatic.  Mouth/Throat: Oropharynx is clear and moist. No oropharyngeal  exudate.  Eyes: Conjunctivae and EOM are normal. Pupils are equal, round, and reactive to light.  Neck: Normal range of motion. Neck supple.  No meningismus.  Cardiovascular: Normal rate, regular rhythm, normal heart sounds and intact distal pulses.  No murmur heard. Pulmonary/Chest: Effort normal and breath sounds normal. No respiratory distress.  Abdominal: Soft. There is no tenderness. There is no rebound and no guarding.  Musculoskeletal: Normal range of motion. She exhibits no edema or tenderness.  Neurological: She is alert and oriented to person, place, and time. No cranial nerve deficit. She exhibits normal muscle tone. Coordination normal.  No ataxia on finger  to nose bilaterally. No pronator drift. 5/5 strength throughout. CN 2-12 intact.Equal grip strength. Sensation intact.   Skin: Skin is warm.  Psychiatric: She has a normal mood and affect. Her behavior is normal.  Nursing note and vitals reviewed.    ED Treatments / Results  Labs (all labs ordered are listed, but only abnormal results are displayed) Labs Reviewed - No data to display  EKG  EKG Interpretation None       Radiology No results found.  Procedures Procedures (including critical care time)  Medications Ordered in ED Medications - No data to display   Initial Impression / Assessment and Plan / ED Course  I have reviewed the triage vital signs and the nursing notes.  Pertinent labs & imaging results that were available during my care of the patient were reviewed by me and considered in my medical decision making (see chart for details).    Episode of headache and lightheadedness with concern for elevated blood pressure.  No chest pain or shortness of breath.  Patient concerned because she has not had her medications today and felt she was more anxious after getting pulled over by the police.  Symptoms have improved.  Her blood pressure on arrival was elevated but likely falsely so due to her  anxiety level.  Patient states that she really just wants to leave.  States she needs discharge papers for her probation officer however. Her neurological exam is nonfocal.  Recommended EKG as well as urinalysis to assess her pregnancy status.  Refused to provide urine sample and refused EKG and left AGAINST MEDICAL ADVICE without informing staff. She appeared to have capacity to make her own decisions.   Final Clinical Impressions(s) / ED Diagnoses   Final diagnoses:  Hypertension, unspecified type    ED Discharge Orders    None       Marlene Pfluger, Jeannett SeniorStephen, MD 10/13/17 [phone]

## 2017-10-17 IMAGING — CT CT MAXILLOFACIAL W/O CM
4 of 6 series · 15 of 47 positions shown, 17 images · non-contrast
Comparison: CT of the head and maxillofacial structures performed
11/09/2016

CLINICAL DATA: Laceration to the right eyebrow. Status post fall
down 15 steps with lawnmower. Concern for head injury. Initial
encounter.

EXAM:
CT HEAD WITHOUT CONTRAST
CT MAXILLOFACIAL WITHOUT CONTRAST
TECHNIQUE: Multidetector CT imaging of the head and maxillofacial structures
were performed using the standard protocol without intravenous
contrast. Multiplanar CT image reconstructions of the maxillofacial
structures were also generated.

[Series 2: head wo · axial · 0.46mm/px · z∈[+41,+141]mm · 6 of 30 slices shown, 8 images]
[im 5/30  brain]
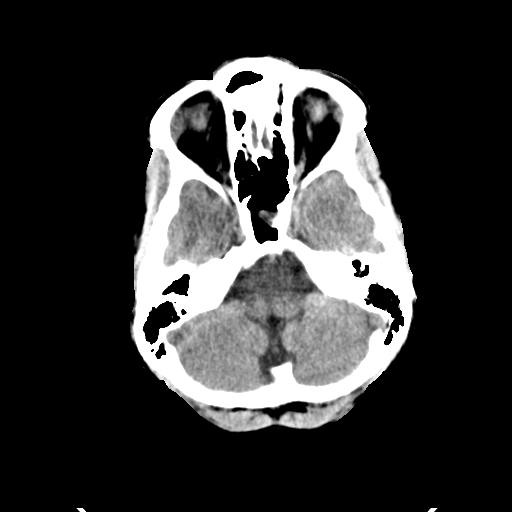
[im 5/30  bone]
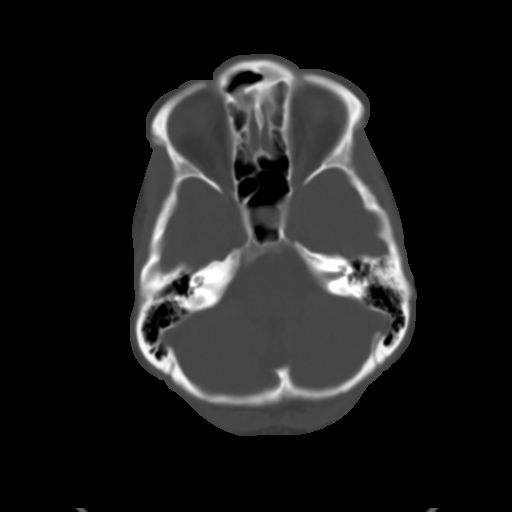
[im 9/30  bone]
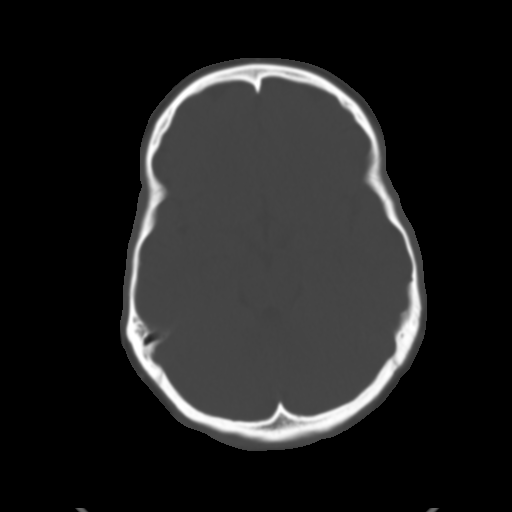
[im 13/30  bone]
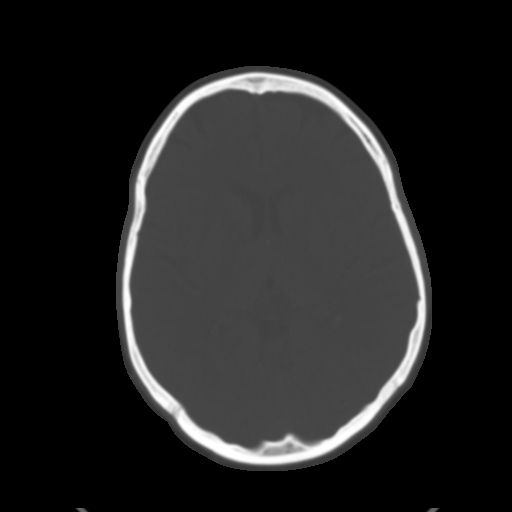
[im 17/30  bone]
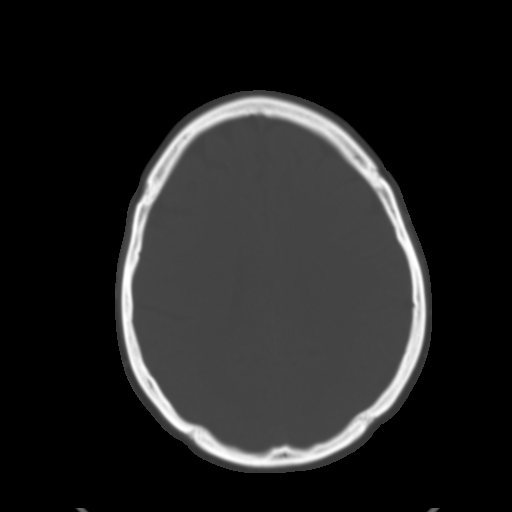
[im 21/30  brain]
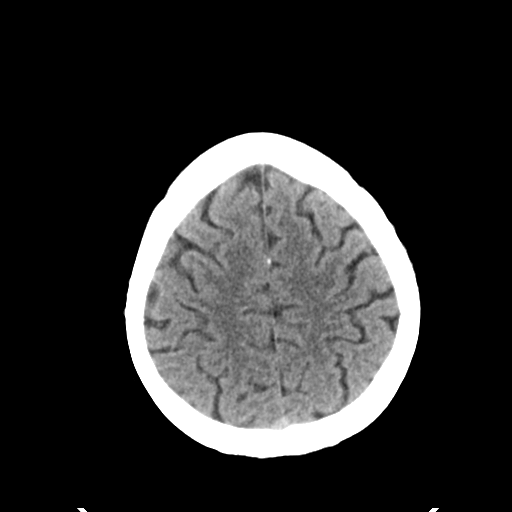
[im 21/30  bone]
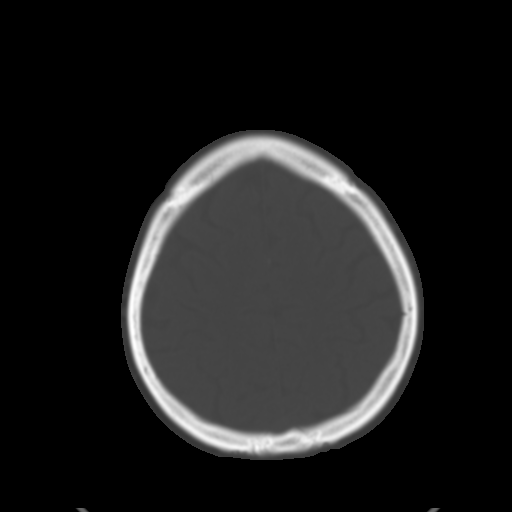
[im 25/30  bone]
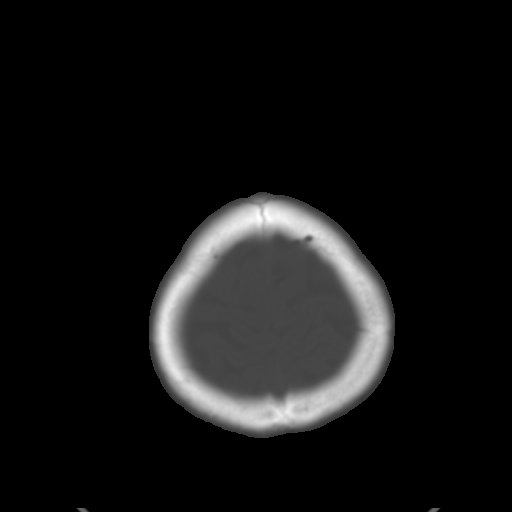

[Series 4: coronal soft tissue · coronal · 0.29mm/px · 3 of 67 slices shown]
[im 17/67  bone]
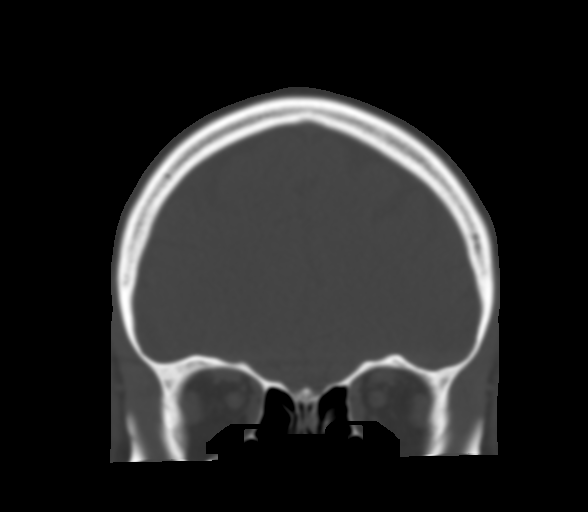
[im 34/67  bone]
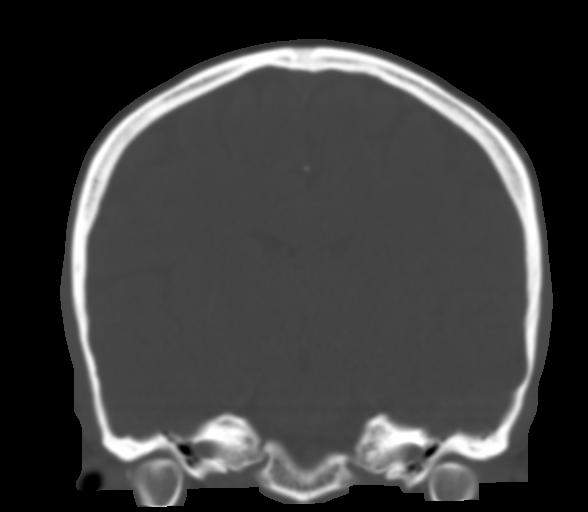
[im 50/67  bone]
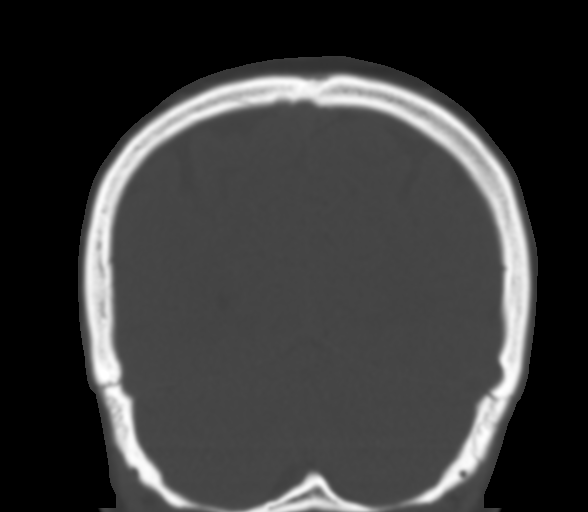

[Series 5: sagittal soft tissue · sagittal · 0.29mm/px · 1 of 55 slices shown]
[im 28/55  bone]
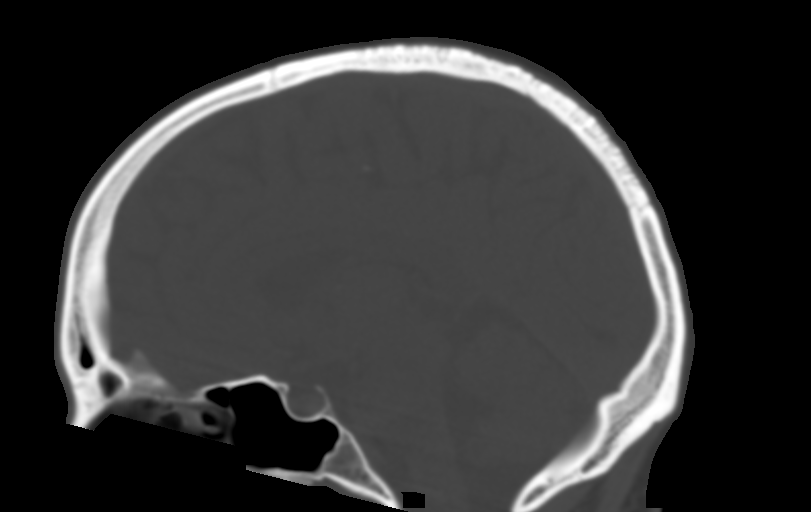

[Series 6: max soft · axial · 0.31mm/px · z∈[-90,-16]mm · 5 of 78 slices shown]
[im 8/78  brain]
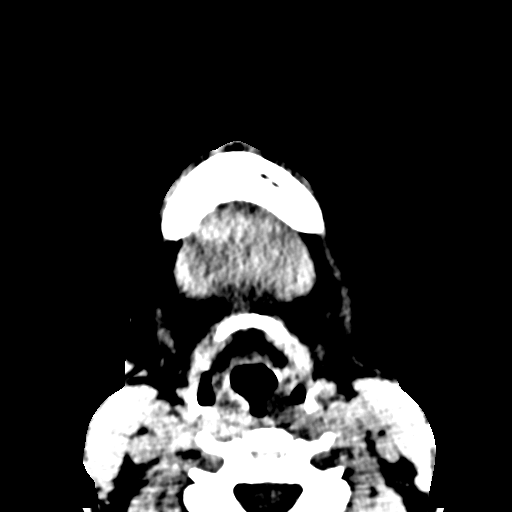
[im 15/78  brain]
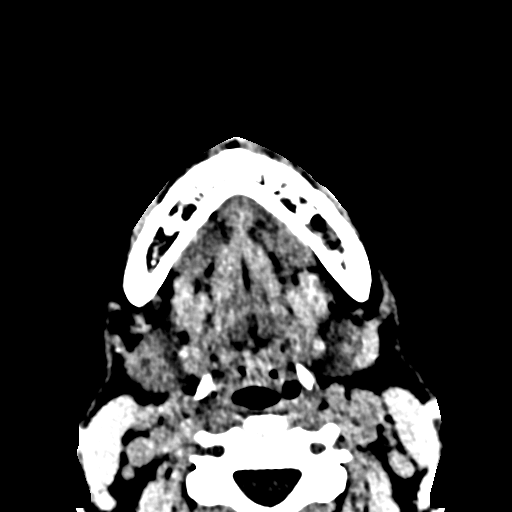
[im 26/78  brain]
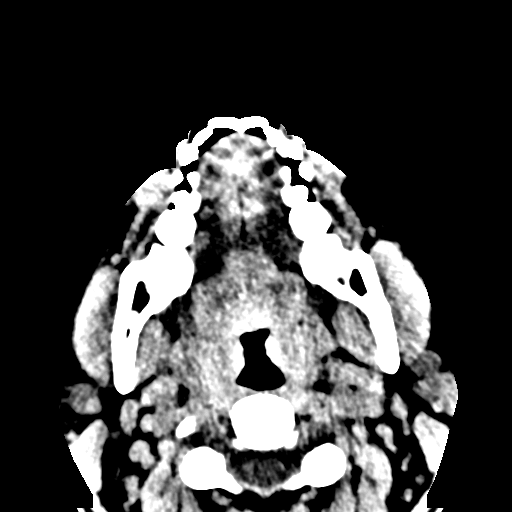
[im 34/78  brain]
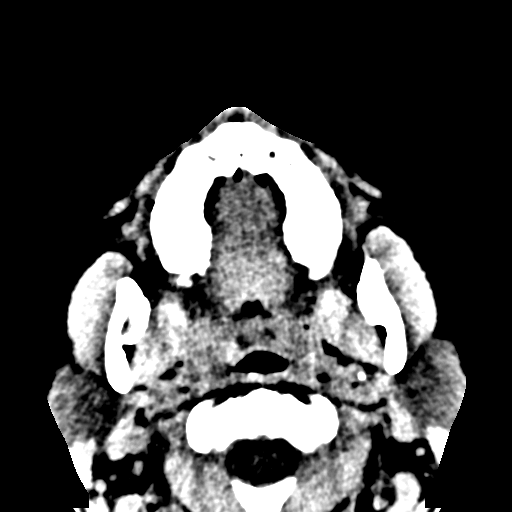
[im 45/78  brain]
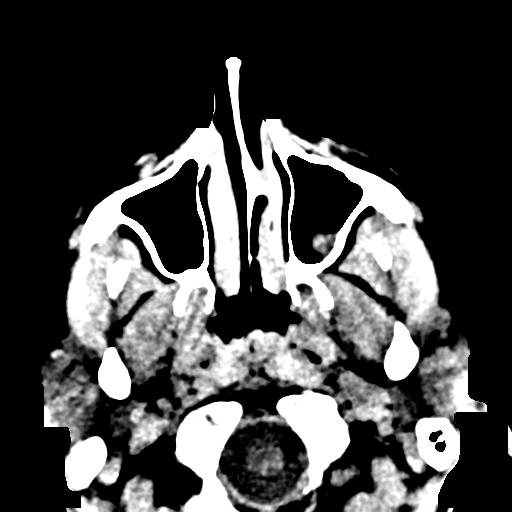

[15 of 47 positions shown; findings below may reference images not displayed]

FINDINGS: CT HEAD FINDINGS

Brain: No evidence of acute infarction, hemorrhage, hydrocephalus,
extra-axial collection or mass lesion/mass effect.

The posterior fossa, including the cerebellum, brainstem and fourth
ventricle, is within normal limits. The third and lateral
ventricles, and basal ganglia are unremarkable in appearance. The
cerebral hemispheres are symmetric in appearance, with normal
gray-white differentiation. No mass effect or midline shift is seen.

Vascular: No hyperdense vessel or unexpected calcification.

Skull: There is no evidence of fracture; visualized osseous
structures are unremarkable in appearance.

Other: No significant soft tissue abnormalities are seen.

CT MAXILLOFACIAL FINDINGS

Osseous: There is no evidence of fracture or dislocation. The
maxilla and mandible appear intact. The nasal bone is unremarkable
in appearance. The visualized dentition demonstrates no acute
abnormality.

Orbits: The orbits are intact bilaterally.

Sinuses: Mucus retention cysts or polyps are noted at the left
maxillary sinus. The remaining visualized paranasal sinuses and
mastoid air cells are well-aerated.

Soft tissues: Mild soft tissue swelling is noted lateral to the
right orbit.

The parapharyngeal fat planes are preserved. The nasopharynx,
oropharynx and hypopharynx are unremarkable in appearance. The
visualized portions of the valleculae and piriform sinuses are
grossly unremarkable. The parotid and submandibular glands are
within normal limits. No cervical lymphadenopathy is seen.
IMPRESSION: 1. No evidence of traumatic intracranial injury or fracture.
2. No evidence of fracture or dislocation with regard to the
maxillofacial structures.
3. Mild soft tissue swelling lateral to the right orbit.
4. Mucus retention cysts or polyps at the left maxillary sinus.

## 2017-10-17 IMAGING — DX DG LUMBAR SPINE COMPLETE 4+V
5 series · 5 of 5 positions shown · non-contrast
Comparison: CT of the abdomen and pelvis performed 11/04/2016

CLINICAL DATA: Status post fall down steps, with lower back pain.
Initial encounter.

EXAM:
LUMBAR SPINE - COMPLETE 4+ VIEW

[l-spine ap]
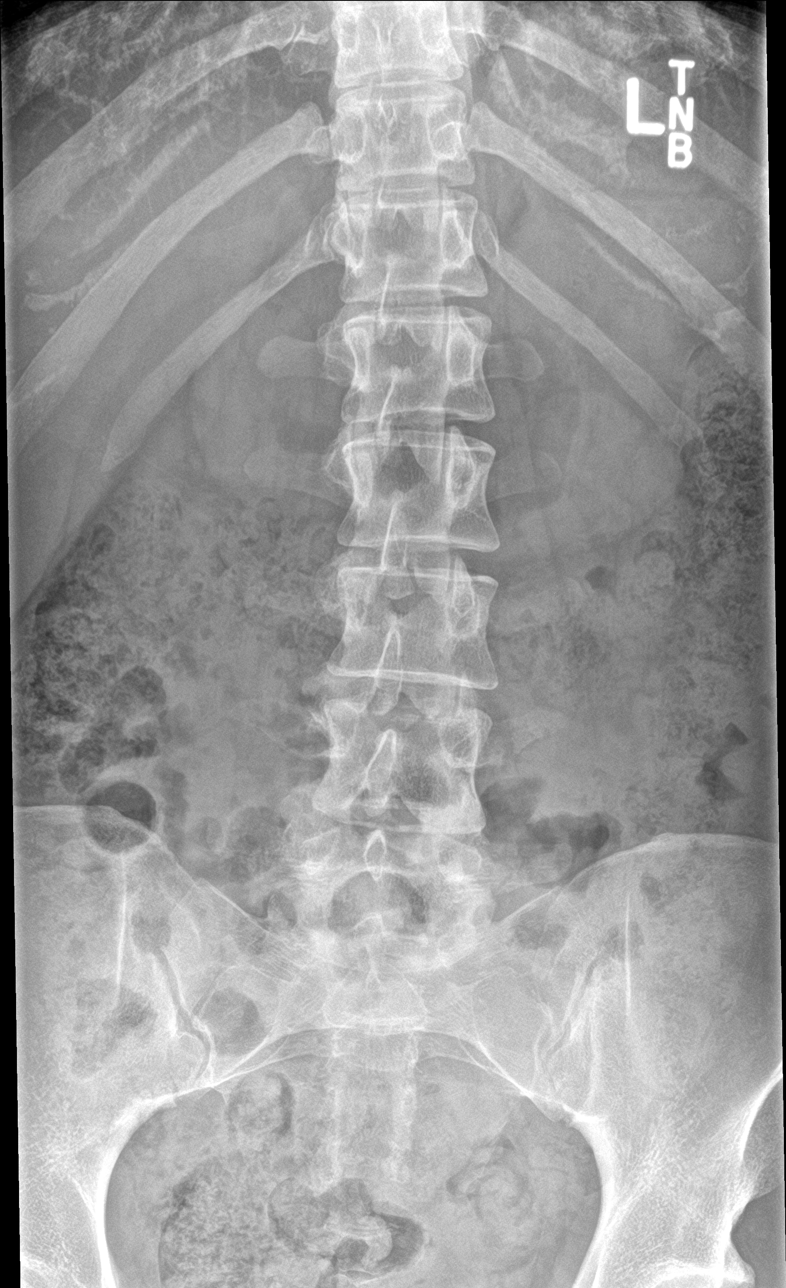

[l-spine obl (1 of 2)]
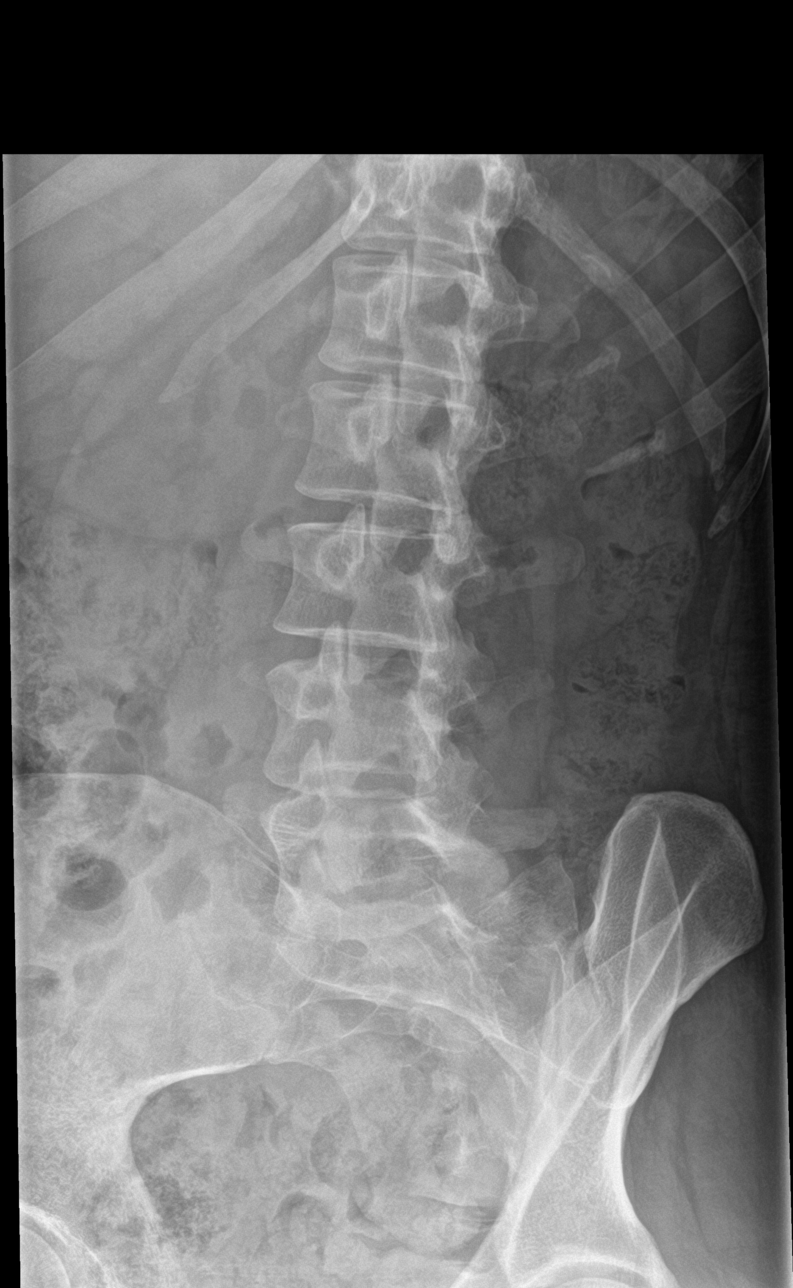

[l-spine obl (2 of 2)]
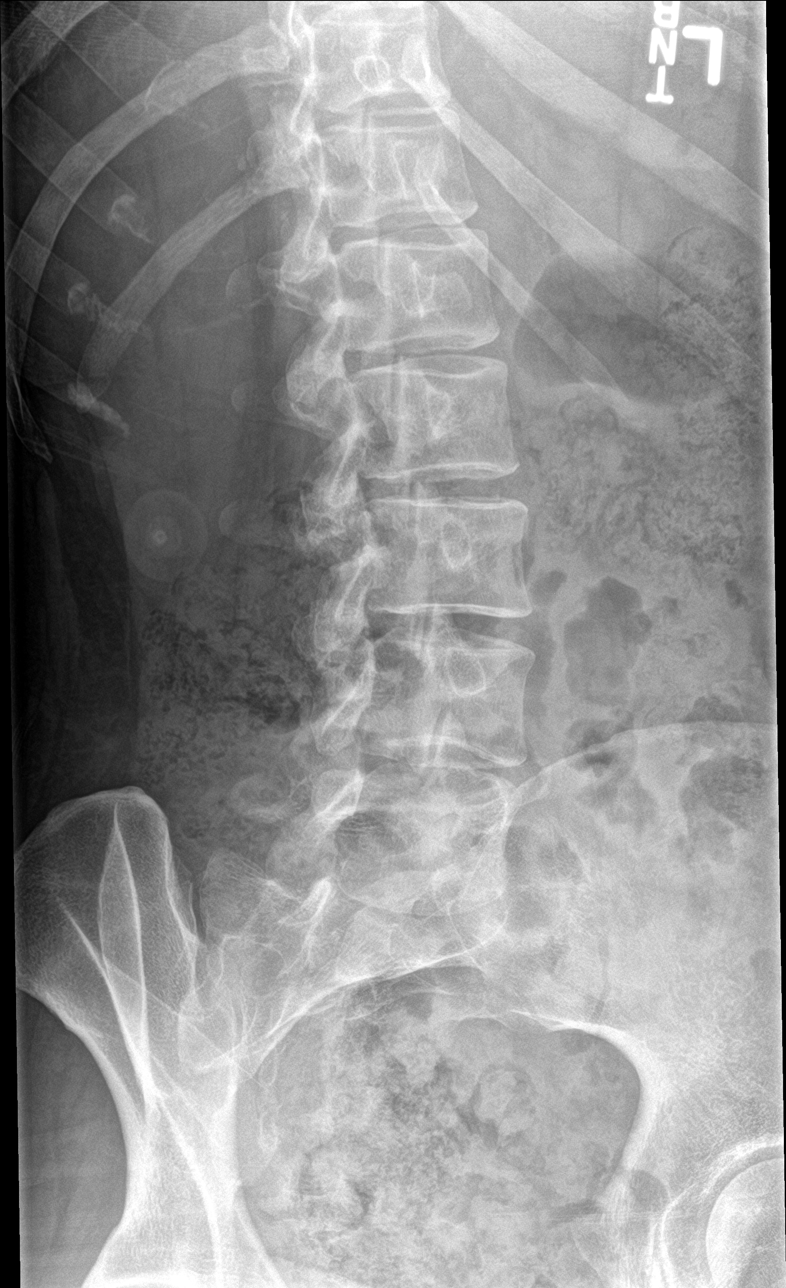

[l-spine lat]
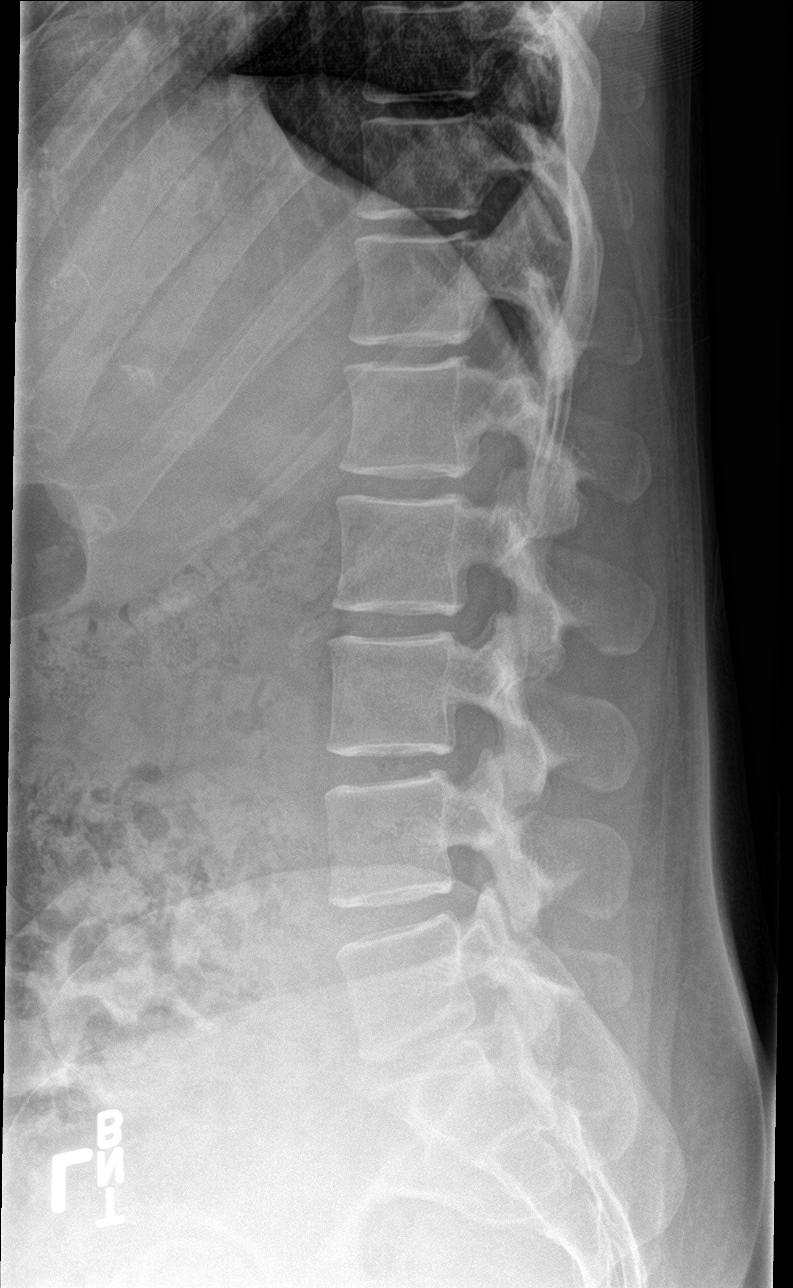

[l-spine spot]
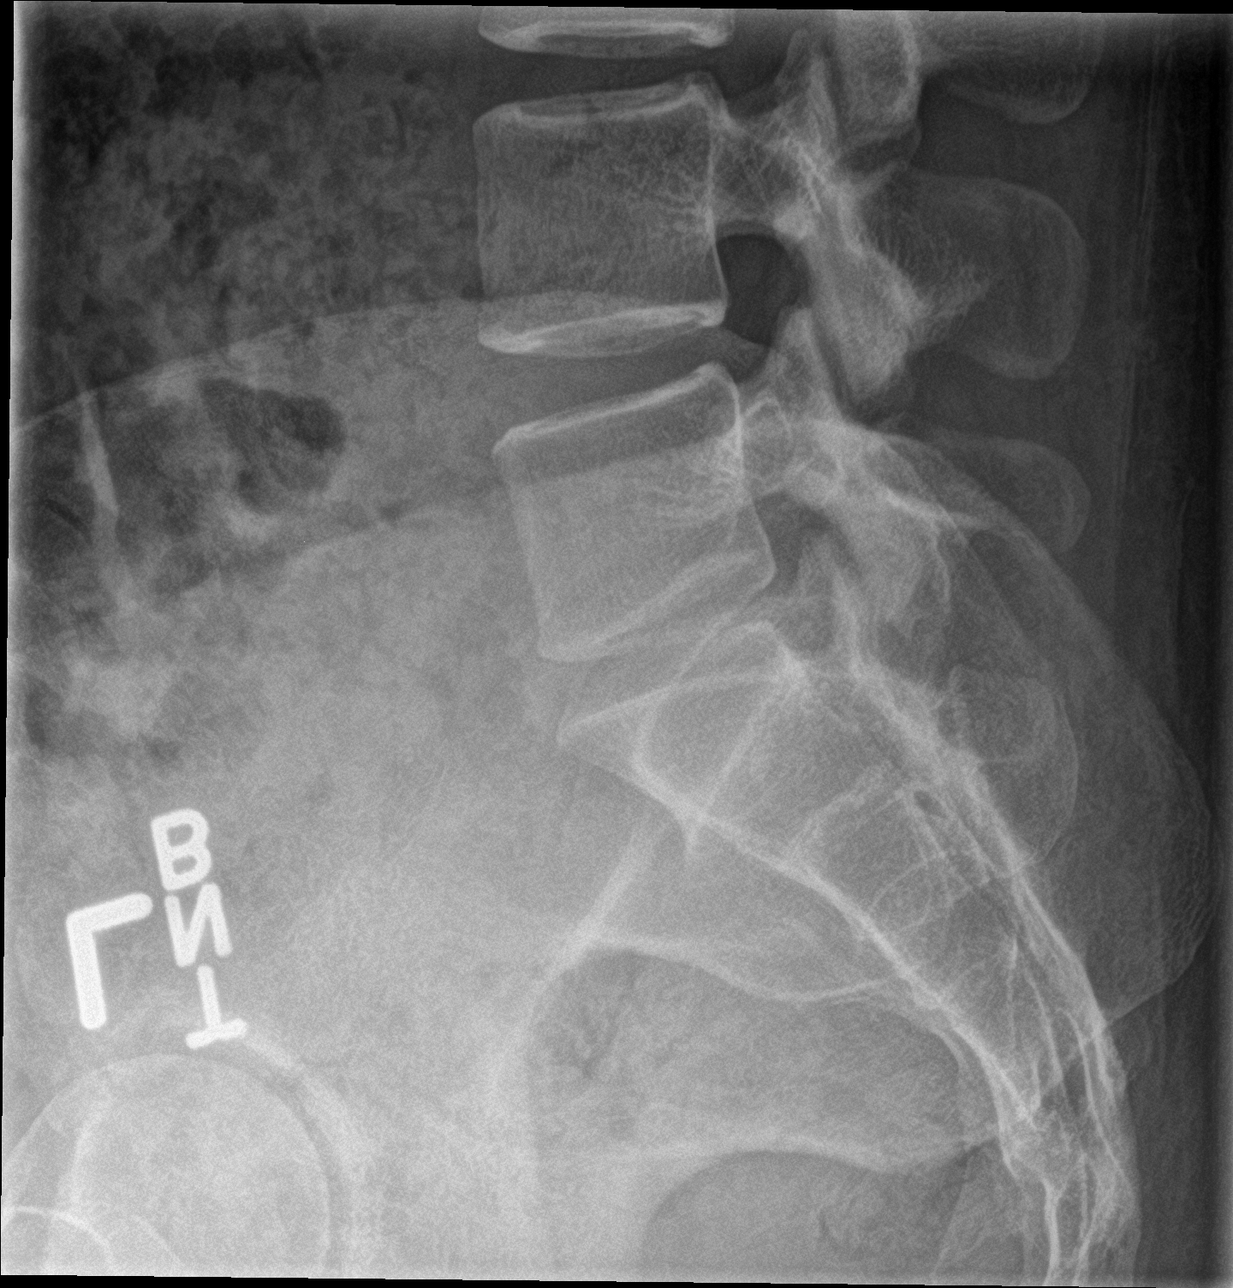

[5 of 5 positions shown; findings below may reference images not displayed]

FINDINGS: There is no evidence of fracture or subluxation. Vertebral bodies
demonstrate normal height and alignment. Intervertebral disc spaces
are preserved. The visualized neural foramina are grossly
unremarkable in appearance.

The visualized bowel gas pattern is unremarkable in appearance; a
large amount of stool is noted in the colon. The sacroiliac joints
are within normal limits.
IMPRESSION: 1. No evidence of fracture or subluxation along the lumbar spine.
2. Large amount of stool noted in the colon, concerning for
constipation.

## 2018-01-04 ENCOUNTER — Other Ambulatory Visit: Payer: Self-pay

## 2018-01-04 ENCOUNTER — Emergency Department (HOSPITAL_COMMUNITY)
Admission: EM | Admit: 2018-01-04 | Discharge: 2018-01-04 | Discharge status: Against Medical Advice | Payer: Medicaid Other | Attending: Emergency Medicine | Admitting: Emergency Medicine

## 2018-01-04 ENCOUNTER — Encounter (HOSPITAL_COMMUNITY): Payer: Self-pay | Admitting: Emergency Medicine

## 2018-01-04 DIAGNOSIS — Z5321 Procedure and treatment not carried out due to patient leaving prior to being seen by health care provider: Secondary | ICD-10-CM | POA: Insufficient documentation

## 2018-01-04 DIAGNOSIS — R109 Unspecified abdominal pain: Secondary | ICD-10-CM | POA: Insufficient documentation

## 2018-01-04 NOTE — ED Triage Notes (Signed)
Pt states abd pain started last night around umbilicus. Pt currently on her period and states increased bleeding than normal, states she is changing her tampon q1h. V/ X5 today. Taken motrin without relief.

## 2018-01-04 NOTE — ED Notes (Signed)
Pt states she is leaving and going to another facility.

## 2018-10-08 ENCOUNTER — Emergency Department (HOSPITAL_COMMUNITY)
Admission: EM | Admit: 2018-10-08 | Discharge: 2018-10-08 | Disposition: A | Discharge status: Home / Self Care | Payer: Self-pay | Attending: Emergency Medicine | Admitting: Emergency Medicine

## 2018-10-08 ENCOUNTER — Encounter (HOSPITAL_COMMUNITY): Payer: Self-pay | Admitting: Emergency Medicine

## 2018-10-08 ENCOUNTER — Other Ambulatory Visit: Payer: Self-pay

## 2018-10-08 DIAGNOSIS — J111 Influenza due to unidentified influenza virus with other respiratory manifestations: Secondary | ICD-10-CM | POA: Insufficient documentation

## 2018-10-08 DIAGNOSIS — F329 Major depressive disorder, single episode, unspecified: Secondary | ICD-10-CM | POA: Insufficient documentation

## 2018-10-08 DIAGNOSIS — Z79899 Other long term (current) drug therapy: Secondary | ICD-10-CM | POA: Insufficient documentation

## 2018-10-08 DIAGNOSIS — J45909 Unspecified asthma, uncomplicated: Secondary | ICD-10-CM | POA: Insufficient documentation

## 2018-10-08 DIAGNOSIS — F17210 Nicotine dependence, cigarettes, uncomplicated: Secondary | ICD-10-CM | POA: Insufficient documentation

## 2018-10-08 DIAGNOSIS — F419 Anxiety disorder, unspecified: Secondary | ICD-10-CM | POA: Insufficient documentation

## 2018-10-08 MED ORDER — OXYMETAZOLINE HCL 0.05 % NA SOLN
2.0000 | Freq: Once | NASAL | Status: AC
Start: 2018-10-08 — End: 2018-10-08
  Administered 2018-10-08: 2 via NASAL
  Filled 2018-10-08: qty 30

## 2018-10-08 MED ORDER — IBUPROFEN 800 MG PO TABS
800.0000 mg | ORAL_TABLET | Freq: Once | ORAL | Status: AC
  Administered 2018-10-08: 800 mg via ORAL
  Filled 2018-10-08: qty 1

## 2018-10-08 MED ORDER — OSELTAMIVIR PHOSPHATE 75 MG PO CAPS: 75.0000 mg | ORAL_CAPSULE | Freq: Two times a day (BID) | ORAL | 0 refills | Status: AC

## 2018-10-08 MED ORDER — OSELTAMIVIR PHOSPHATE 75 MG PO CAPS
75.0000 mg | ORAL_CAPSULE | Freq: Once | ORAL | Status: AC
  Administered 2018-10-08: 75 mg via ORAL
  Filled 2018-10-08: qty 1

## 2018-10-08 NOTE — ED Triage Notes (Signed)
Chills, congestion, body aches.  Son recently dx with the flu

## 2018-10-08 NOTE — ED Provider Notes (Signed)
[facility] EMERGENCY DEPARTMENT Provider Note   CSN: [id number] Arrival date & time: 10/08/18  2152     History   Chief Complaint Chief Complaint  Patient presents with  . Influenza    HPI Adrienne Crawford is a 28 y.o. female.  The history is provided by the patient.  Influenza  Presenting symptoms: cough, headache, myalgias and rhinorrhea   Presenting symptoms: no shortness of breath   Severity:  Moderate Onset quality:  Gradual Duration:  2 days Progression:  Worsening Chronicity:  New Relieved by:  Nothing Worsened by:  Nothing Ineffective treatments: TheraFlu. Associated symptoms: chills and nasal congestion   Risk factors: sick contacts     Past Medical History:  Diagnosis Date  . Anemia 02/07/2013  . Anxiety   . Asthma   . Depression   . Panic attack   . PID (acute pelvic inflammatory disease)   . Pregnant   . UTI (urinary tract infection)     Patient Active Problem List   Diagnosis Date Noted  . Severe recurrent major depression without psychotic features (HCC) 09/05/2017  . Polysubstance (excluding opioids) dependence, daily use (HCC) 09/05/2017  . Substance induced mood disorder (HCC) 09/05/2017  . PTSD (post-traumatic stress disorder) 09/05/2017  . Anemia 02/07/2013  . Depression 02/07/2013    Past Surgical History:  Procedure Laterality Date  . BLADDER SURGERY       OB History    Gravida  3   Para  2   Term  2   Preterm      AB  1   Living  2     SAB  1   TAB      Ectopic      Multiple      Live Births  2            Home Medications    Prior to Admission medications   Medication Sig Start Date End Date Taking? Authorizing Provider  acetaminophen (TYLENOL) 325 MG tablet Take 2 tablets (650 mg total) by mouth every 6 (six) hours as needed. For fever/pain 09/09/17   Armandina Stammer I, NP  albuterol (PROVENTIL HFA;VENTOLIN HFA) 108 (90 Base) MCG/ACT inhaler Inhale 1-2 puffs into the lungs every 6 (six) hours as  needed for wheezing or shortness of breath. 09/09/17   Armandina Stammer I, NP  ARIPiprazole (ABILIFY) 5 MG tablet Take 1 tablet (5 mg total) by mouth daily. For mood control 09/10/17   Armandina Stammer I, NP  gabapentin (NEURONTIN) 300 MG capsule Take 1 capsule (300 mg total) by mouth 3 (three) times daily. For agitation 09/09/17   Armandina Stammer I, NP  hydrOXYzine (ATARAX/VISTARIL) 50 MG tablet Take 1 tablet (50 mg total) by mouth every 6 (six) hours as needed for anxiety. 09/09/17   Armandina Stammer I, NP  mirtazapine (REMERON) 15 MG tablet Take 1 tablet (15 mg total) by mouth at bedtime. For depression/anxiety 09/09/17   Armandina Stammer I, NP  nicotine polacrilex (NICORETTE) 2 MG gum Take 1 each (2 mg total) by mouth as needed for smoking cessation. (May buy from over the counter at the pharmacy): For smoking cessation 09/09/17   Armandina Stammer I, NP  prazosin (MINIPRESS) 2 MG capsule Take 1 capsule (2 mg total) by mouth at bedtime. For nightmares 09/09/17   Armandina Stammer I, NP  traZODone (DESYREL) 100 MG tablet Take 1 tablet (100 mg total) by mouth at bedtime as needed for sleep. 09/09/17   Sanjuana Kava, NP  Family History Family History  Problem Relation Age of Onset  . Hypertension Paternal Grandfather   . Stroke Paternal Grandfather   . Cancer Maternal Grandmother   . Hypertension Maternal Grandmother   . Cancer Maternal Grandfather   . Hypertension Father   . Stroke Father   . Cancer Mother   . Hypertension Mother   . Cancer Other        Lung, Throat annd Mouth    Social History Social History   Tobacco Use  . Smoking status: Current Every Day Smoker    Packs/day: 0.50    Types: Cigarettes  . Smokeless tobacco: Never Used  . Tobacco comment: Quit smoking with positive preg. test.  Substance Use Topics  . Alcohol use: Yes    Comment: occassional  . Drug use: Not Currently    Types: Marijuana, Cocaine    Comment: 12/05/17     Allergies   Sulfa antibiotics   Review of Systems Review of  Systems  Constitutional: Positive for activity change, appetite change and chills.       All ROS Neg except as noted in HPI  HENT: Positive for congestion and rhinorrhea. Negative for nosebleeds.   Eyes: Negative for photophobia and discharge.  Respiratory: Positive for cough. Negative for shortness of breath and wheezing.   Cardiovascular: Negative for chest pain and palpitations.  Gastrointestinal: Negative for abdominal pain and blood in stool.  Genitourinary: Negative for dysuria, frequency and hematuria.  Musculoskeletal: Positive for myalgias. Negative for arthralgias, back pain and neck pain.  Skin: Negative.   Neurological: Positive for headaches. Negative for dizziness, seizures and speech difficulty.  Psychiatric/Behavioral: Negative for confusion and hallucinations.     Physical Exam Updated Vital Signs BP 133/75 (BP Location: Right Arm)   Pulse 94   Temp 98.9 F (37.2 C) (Oral)   Resp 18   Ht 5\' 6"  (1.676 m)   Wt 93 kg   LMP 10/06/2018   SpO2 96%   BMI 33.09 kg/m   Physical Exam Vitals signs and nursing note reviewed.  Constitutional:      General: She is not in acute distress.    Appearance: She is well-developed.  HENT:     Head: Normocephalic and atraumatic.     Right Ear: External ear normal.     Left Ear: External ear normal.     Nose: Congestion present.  Eyes:     General: No scleral icterus.       Right eye: No discharge.        Left eye: No discharge.     Conjunctiva/sclera: Conjunctivae normal.  Neck:     Musculoskeletal: Neck supple.     Trachea: No tracheal deviation.  Cardiovascular:     Rate and Rhythm: Normal rate and regular rhythm.  Pulmonary:     Effort: Pulmonary effort is normal. No respiratory distress.     Breath sounds: Normal breath sounds. No stridor. No wheezing or rales.  Abdominal:     General: Bowel sounds are normal. There is no distension.     Palpations: Abdomen is soft.     Tenderness: There is no abdominal  tenderness. There is no guarding or rebound.  Musculoskeletal:        General: No tenderness.  Skin:    General: Skin is warm and dry.     Findings: No rash.  Neurological:     Mental Status: She is alert.     Cranial Nerves: No cranial nerve deficit (no facial droop,  extraocular movements intact, no slurred speech).     Sensory: No sensory deficit.     Motor: No abnormal muscle tone or seizure activity.     Coordination: Coordination normal.      ED Treatments / Results  Labs (all labs ordered are listed, but only abnormal results are displayed) Labs Reviewed - No data to display  EKG None  Radiology No results found.  Procedures Procedures (including critical care time)  Medications Ordered in ED Medications - No data to display   Initial Impression / Assessment and Plan / ED Course  I have reviewed the triage vital signs and the nursing notes.  Pertinent labs & imaging results that were available during my care of the patient were reviewed by me and considered in my medical decision making (see chart for details).       Final Clinical Impressions(s) / ED Diagnoses MDM  Patient reports her son was recently diagnosed with flu.  She now has congestion, sweats, chills, body aches, and generally not feeling well.  I discussed with the patient that she should be treated for influenza.  The patient was provided with a mask.  She will increase her fluids.  She will use Tylenol every 4 hours, or ibuprofen every 6 hours for fever, chills, and/or aching.  Prescription for Tamiflu given.  The patient will use Afrin spray for congestion daily for 5 days only.  The patient is to see her primary physician or return to the emergency department if any changes in condition, problems, or concerns.   Final diagnoses:  Influenza    ED Discharge Orders         Ordered    oseltamivir (TAMIFLU) 75 MG capsule  Every 12 hours     10/08/18 2226           Ivery QualeBryant, Sanjuana Mruk,  PA-C 10/08/18 2233    Mesner, Barbara CowerJason, MD 10/08/18 2329

## 2018-10-08 NOTE — Discharge Instructions (Signed)
Your examination favors influenza.  Please use Afrin every 8 hours for 5 days only.  Use Tamiflu every 12 hours.  Please increase fluids such as water, Gatorade, juices, etc.  Please wash your hands, and have everyone in the house wash hands frequently.  Wipe off surfaces as needed.  Use 600 mg of ibuprofen with each meal and at bedtime, or 4 times daily. Please see your primary physician or return to the emergency department if any changes in your condition, problems, or concerns.

## 2019-05-24 DEATH — deceased
